# Patient Record
Sex: Female | Born: 1960
Health system: Southern US, Community
[De-identification: ages and names within clinical notes are randomized; demographics above are authoritative.]

## PROBLEM LIST (undated history)

## (undated) DIAGNOSIS — N871 Moderate cervical dysplasia: Secondary | ICD-10-CM

## (undated) DIAGNOSIS — F329 Major depressive disorder, single episode, unspecified: Secondary | ICD-10-CM

## (undated) DIAGNOSIS — F909 Attention-deficit hyperactivity disorder, unspecified type: Secondary | ICD-10-CM

## (undated) DIAGNOSIS — E559 Vitamin D deficiency, unspecified: Secondary | ICD-10-CM

## (undated) DIAGNOSIS — F32A Depression, unspecified: Secondary | ICD-10-CM

## (undated) DIAGNOSIS — Z9889 Other specified postprocedural states: Secondary | ICD-10-CM

## (undated) DIAGNOSIS — R112 Nausea with vomiting, unspecified: Secondary | ICD-10-CM

## (undated) DIAGNOSIS — R51 Headache: Secondary | ICD-10-CM

## (undated) DIAGNOSIS — F419 Anxiety disorder, unspecified: Secondary | ICD-10-CM

## (undated) HISTORY — DX: Attention-deficit hyperactivity disorder, unspecified type: F90.9

## (undated) HISTORY — DX: Anxiety disorder, unspecified: F41.9

## (undated) HISTORY — DX: Moderate cervical dysplasia: N87.1

## (undated) HISTORY — DX: Depression, unspecified: F32.A

## (undated) HISTORY — DX: Major depressive disorder, single episode, unspecified: F32.9

---

## 1998-10-18 HISTORY — PX: KNEE SURGERY: SHX244

## 2001-03-08 ENCOUNTER — Other Ambulatory Visit: Admission: RE | Admit: 2001-03-08 | Discharge: 2001-03-08 | Payer: Self-pay | Admitting: Gynecology

## 2001-06-02 ENCOUNTER — Encounter: Admission: RE | Admit: 2001-06-02 | Discharge: 2001-08-31 | Payer: Self-pay | Admitting: Gynecology

## 2001-08-30 ENCOUNTER — Inpatient Hospital Stay (HOSPITAL_COMMUNITY): Admission: AD | Admit: 2001-08-30 | Discharge: 2001-09-01 | Payer: Self-pay | Admitting: Gynecology

## 2001-10-16 ENCOUNTER — Other Ambulatory Visit: Admission: RE | Admit: 2001-10-16 | Discharge: 2001-10-16 | Payer: Self-pay | Admitting: Gynecology

## 2001-12-01 ENCOUNTER — Ambulatory Visit (HOSPITAL_COMMUNITY): Admission: RE | Admit: 2001-12-01 | Discharge: 2001-12-01 | Payer: Self-pay | Admitting: Gynecology

## 2002-05-18 ENCOUNTER — Other Ambulatory Visit: Admission: RE | Admit: 2002-05-18 | Discharge: 2002-05-18 | Payer: Self-pay | Admitting: Gynecology

## 2002-10-29 ENCOUNTER — Other Ambulatory Visit: Admission: RE | Admit: 2002-10-29 | Discharge: 2002-10-29 | Payer: Self-pay | Admitting: Gynecology

## 2003-05-17 ENCOUNTER — Ambulatory Visit (HOSPITAL_BASED_OUTPATIENT_CLINIC_OR_DEPARTMENT_OTHER): Admission: RE | Admit: 2003-05-17 | Discharge: 2003-05-17 | Payer: Self-pay | Admitting: Gynecology

## 2004-09-07 ENCOUNTER — Ambulatory Visit: Payer: Self-pay | Admitting: Licensed Clinical Social Worker

## 2004-09-08 ENCOUNTER — Ambulatory Visit: Payer: Self-pay | Admitting: Licensed Clinical Social Worker

## 2004-09-23 ENCOUNTER — Ambulatory Visit: Payer: Self-pay | Admitting: Licensed Clinical Social Worker

## 2004-09-30 ENCOUNTER — Ambulatory Visit: Payer: Self-pay | Admitting: Licensed Clinical Social Worker

## 2004-10-06 ENCOUNTER — Ambulatory Visit: Payer: Self-pay | Admitting: Licensed Clinical Social Worker

## 2004-10-21 ENCOUNTER — Ambulatory Visit: Payer: Self-pay | Admitting: Licensed Clinical Social Worker

## 2004-11-04 ENCOUNTER — Ambulatory Visit: Payer: Self-pay | Admitting: Licensed Clinical Social Worker

## 2004-11-11 ENCOUNTER — Ambulatory Visit: Payer: Self-pay | Admitting: Licensed Clinical Social Worker

## 2005-01-27 ENCOUNTER — Other Ambulatory Visit: Admission: RE | Admit: 2005-01-27 | Discharge: 2005-01-27 | Payer: Self-pay | Admitting: Gynecology

## 2005-08-27 ENCOUNTER — Ambulatory Visit (HOSPITAL_COMMUNITY): Admission: AD | Admit: 2005-08-27 | Discharge: 2005-08-27 | Payer: Self-pay | Admitting: Gynecology

## 2005-10-07 ENCOUNTER — Observation Stay (HOSPITAL_COMMUNITY): Admission: RE | Admit: 2005-10-07 | Discharge: 2005-10-08 | Payer: Self-pay | Admitting: Gynecology

## 2005-10-18 HISTORY — PX: ABDOMINAL HYSTERECTOMY: SHX81

## 2005-10-18 HISTORY — PX: DILATION AND CURETTAGE OF UTERUS: SHX78

## 2012-02-24 ENCOUNTER — Other Ambulatory Visit: Payer: Self-pay | Admitting: *Deleted

## 2012-02-24 DIAGNOSIS — N6489 Other specified disorders of breast: Secondary | ICD-10-CM

## 2012-02-28 ENCOUNTER — Encounter: Payer: Self-pay | Admitting: Gynecology

## 2013-05-24 ENCOUNTER — Encounter: Payer: Self-pay | Admitting: Gynecology

## 2013-07-23 ENCOUNTER — Ambulatory Visit (INDEPENDENT_AMBULATORY_CARE_PROVIDER_SITE_OTHER): Payer: BC Managed Care – PPO | Admitting: Gynecology

## 2013-07-23 ENCOUNTER — Other Ambulatory Visit (HOSPITAL_COMMUNITY)
Admission: RE | Admit: 2013-07-23 | Discharge: 2013-07-23 | Disposition: A | Discharge status: Home / Self Care | Payer: BC Managed Care – PPO | Source: Ambulatory Visit | Attending: Gynecology | Admitting: Gynecology

## 2013-07-23 ENCOUNTER — Encounter: Payer: Self-pay | Admitting: Gynecology

## 2013-07-23 VITALS — BP 132/90 | Ht 63.5 in | Wt 229.0 lb

## 2013-07-23 DIAGNOSIS — Z87410 Personal history of cervical dysplasia: Secondary | ICD-10-CM | POA: Insufficient documentation

## 2013-07-23 DIAGNOSIS — Z23 Encounter for immunization: Secondary | ICD-10-CM

## 2013-07-23 DIAGNOSIS — Z1151 Encounter for screening for human papillomavirus (HPV): Secondary | ICD-10-CM | POA: Insufficient documentation

## 2013-07-23 DIAGNOSIS — F329 Major depressive disorder, single episode, unspecified: Secondary | ICD-10-CM | POA: Insufficient documentation

## 2013-07-23 DIAGNOSIS — R635 Abnormal weight gain: Secondary | ICD-10-CM | POA: Insufficient documentation

## 2013-07-23 DIAGNOSIS — F32A Depression, unspecified: Secondary | ICD-10-CM | POA: Insufficient documentation

## 2013-07-23 DIAGNOSIS — Z01419 Encounter for gynecological examination (general) (routine) without abnormal findings: Secondary | ICD-10-CM | POA: Insufficient documentation

## 2013-07-23 LAB — CBC WITH DIFFERENTIAL/PLATELET
Basophils Absolute: 0 10*3/uL (ref 0.0–0.1)
Basophils Relative: 1 % (ref 0–1)
Eosinophils Absolute: 0.1 10*3/uL (ref 0.0–0.7)
Eosinophils Relative: 2 % (ref 0–5)
HCT: 42.8 % (ref 36.0–46.0)
Hemoglobin: 14.8 g/dL (ref 12.0–15.0)
Lymphocytes Relative: 32 % (ref 12–46)
Lymphs Abs: 2.3 10*3/uL (ref 0.7–4.0)
MCH: 28 pg (ref 26.0–34.0)
MCHC: 34.6 g/dL (ref 30.0–36.0)
MCV: 81.1 fL (ref 78.0–100.0)
Monocytes Absolute: 0.5 10*3/uL (ref 0.1–1.0)
Monocytes Relative: 7 % (ref 3–12)
Neutro Abs: 4.4 10*3/uL (ref 1.7–7.7)
Neutrophils Relative %: 58 % (ref 43–77)
Platelets: 308 10*3/uL (ref 150–400)
RBC: 5.28 MIL/uL — ABNORMAL HIGH (ref 3.87–5.11)
RDW: 13.4 % (ref 11.5–15.5)
WBC: 7.4 10*3/uL (ref 4.0–10.5)

## 2013-07-23 LAB — COMPREHENSIVE METABOLIC PANEL
ALT: 11 U/L (ref 0–35)
AST: 13 U/L (ref 0–37)
Albumin: 4.1 g/dL (ref 3.5–5.2)
Alkaline Phosphatase: 86 U/L (ref 39–117)
BUN: 8 mg/dL (ref 6–23)
CO2: 27 mEq/L (ref 19–32)
Calcium: 9.2 mg/dL (ref 8.4–10.5)
Chloride: 108 mEq/L (ref 96–112)
Creat: 0.75 mg/dL (ref 0.50–1.10)
Glucose, Bld: 95 mg/dL (ref 70–99)
Potassium: 4.6 mEq/L (ref 3.5–5.3)
Sodium: 139 mEq/L (ref 135–145)
Total Bilirubin: 0.7 mg/dL (ref 0.3–1.2)
Total Protein: 6.9 g/dL (ref 6.0–8.3)

## 2013-07-23 LAB — TSH: TSH: 2.134 u[IU]/mL (ref 0.350–4.500)

## 2013-07-23 LAB — CHOLESTEROL, TOTAL: Cholesterol: 187 mg/dL (ref 0–200)

## 2013-07-23 NOTE — Addendum Note (Signed)
Addended by: Bertram Savin A on: 07/23/2013 12:45 PM   Modules accepted: Orders

## 2013-07-23 NOTE — Patient Instructions (Addendum)
Colonoscopy A colonoscopy is an exam to evaluate your entire colon. In this exam, your colon is cleansed. A long fiberoptic tube is inserted through your rectum and into your colon. The fiberoptic scope (endoscope) is a long bundle of enclosed and very flexible fibers. These fibers transmit light to the area examined and send images from that area to your caregiver. Discomfort is usually minimal. You may be given a drug to help you sleep (sedative) during or prior to the procedure. This exam helps to detect lumps (tumors), polyps, inflammation, and areas of bleeding. Your caregiver may also take a small piece of tissue (biopsy) that will be examined under a microscope. LET YOUR CAREGIVER KNOW ABOUT:   Allergies to food or medicine.  Medicines taken, including vitamins, herbs, eyedrops, over-the-counter medicines, and creams.  Use of steroids (by mouth or creams).  Previous problems with anesthetics or numbing medicines.  History of bleeding problems or blood clots.  Previous surgery.  Other health problems, including diabetes and kidney problems.  Possibility of pregnancy, if this applies. BEFORE THE PROCEDURE   A clear liquid diet may be required for 2 days before the exam.  Ask your caregiver about changing or stopping your regular medications.  Liquid injections (enemas) or laxatives may be required.  A large amount of electrolyte solution may be given to you to drink over a short period of time. This solution is used to clean out your colon.  You should be present 60 minutes prior to your procedure or as directed by your caregiver. AFTER THE PROCEDURE   If you received a sedative or pain relieving medication, you will need to arrange for someone to drive you home.  Occasionally, there is a little blood passed with the first bowel movement. Do not be concerned. FINDING OUT THE RESULTS OF YOUR TEST Not all test results are available during your visit. If your test results are  not back during the visit, make an appointment with your caregiver to find out the results. Do not assume everything is normal if you have not heard from your caregiver or the medical facility. It is important for you to follow up on all of your test results. HOME CARE INSTRUCTIONS   It is not unusual to pass moderate amounts of gas and experience mild abdominal cramping following the procedure. This is due to air being used to inflate your colon during the exam. Walking or a warm pack on your belly (abdomen) may help.  You may resume all normal meals and activities after sedatives and medicines have worn off.  Only take over-the-counter or prescription medicines for pain, discomfort, or fever as directed by your caregiver. Do not use aspirin or blood thinners if a biopsy was taken. Consult your caregiver for medicine usage if biopsies were taken. SEEK IMMEDIATE MEDICAL CARE IF:   You have a fever.  You pass large blood clots or fill a toilet with blood following the procedure. This may also occur 10 to 14 days following the procedure. This is more likely if a biopsy was taken.  You develop abdominal pain that keeps getting worse and cannot be relieved with medicine. Document Released: 10/01/2000 Document Revised: 12/27/2011 Document Reviewed: 05/16/2008 [facility] Patient Information 2014 [place], [place]. Influenza Vaccine (Flu Vaccine, Inactivated) 2013 2014 What You Need to Know WHY GET VACCINATED?  Influenza ("flu") is a contagious disease that spreads around the [place] every winter, usually between October and May.  Flu is caused by the influenza virus, and can  be spread by coughing, sneezing, and close contact.  Anyone can get flu, but the risk of getting flu is highest among children. Symptoms come on suddenly and may last several days. They can include:  Fever or chills.  Sore throat.  Muscle aches.  Fatigue.  Cough.  Headache.  Runny or stuffy nose. Flu can make  some people much sicker than others. These people include young children, people 57 and older, pregnant women, and people with certain health conditions such as heart, lung or kidney disease, or a weakened immune system. Flu vaccine is especially important for these people, and anyone in close contact with them. Flu can also lead to pneumonia, and make existing medical conditions worse. It can cause diarrhea and seizures in children. Each year thousands of people in the [place] die from flu, and many more are hospitalized. Flu vaccine is the best protection we have from flu and its complications. Flu vaccine also helps prevent spreading flu from person to person. INACTIVATED FLU VACCINE There are 2 types of influenza vaccine:  You are getting an inactivated flu vaccine, which does not contain any live influenza virus. It is given by injection with a needle, and often called the "flu shot."  A different live, attenuated (weakened) influenza vaccine is sprayed into the nostrils. This vaccine is described in a separate Vaccine Information Statement. Flu vaccine is recommended every year. Children 6 months through 32 years of age should get 2 doses the first year they get vaccinated. Flu viruses are always changing. Each year's flu vaccine is made to protect from viruses that are most likely to cause disease that year. While flu vaccine cannot prevent all cases of flu, it is our best defense against the disease. Inactivated flu vaccine protects against 3 or 4 different influenza viruses. It takes about 2 weeks for protection to develop after the vaccination, and protection lasts several months to a year. Some illnesses that are not caused by influenza virus are often mistaken for flu. Flu vaccine will not prevent these illnesses. It can only prevent influenza. A "high-dose" flu vaccine is available for people 49 years of age and older. The person giving you the vaccine can tell you more about  it. Some inactivated flu vaccine contains a very small amount of a mercury-based preservative called thimerosal. Studies have shown that thimerosal in vaccines is not harmful, but flu vaccines that do not contain a preservative are available. SOME PEOPLE SHOULD NOT GET THIS VACCINE Tell the person who gives you the vaccine:  If you have any severe (life-threatening) allergies. If you ever had a life-threatening allergic reaction after a dose of flu vaccine, or have a severe allergy to any part of this vaccine, you may be advised not to get a dose. Most, but not all, types of flu vaccine contain a small amount of egg.  If you ever had Guillain Barr Syndrome (a severe paralyzing illness, also called GBS). Some people with a history of GBS should not get this vaccine. This should be discussed with your doctor.  If you are not feeling well. They might suggest waiting until you feel better. But you should come back. RISKS OF A VACCINE REACTION With a vaccine, like any medicine, there is a chance of side effects. These are usually mild and go away on their own. Serious side effects are also possible, but are very rare. Inactivated flu vaccine does not contain live flu virus, sogetting flu from this vaccine is not possible. Brief  fainting spells and related symptoms (such as jerking movements) can happen after any medical procedure, including vaccination. Sitting or lying down for about 15 minutes after a vaccination can help prevent fainting and injuries caused by falls. Tell your doctor if you feel dizzy or lightheaded, or have vision changes or ringing in the ears. Mild problems following inactivated flu vaccine:  Soreness, redness, or swelling where the shot was given.  Hoarseness; sore, red or itchy eyes; or cough.  Fever.  Aches.  Headache.  Itching.  Fatigue. If these problems occur, they usually begin soon after the shot and last 1 or 2 days. Moderate problems following inactivated  flu vaccine:  Young children who get inactivated flu vaccine and pneumococcal vaccine (PCV13) at the same time may be at increased risk for seizures caused by fever. Ask your doctor for more information. Tell your doctor if a child who is getting flu vaccine has ever had a seizure. Severe problems following inactivated flu vaccine:  A severe allergic reaction could occur after any vaccine (estimated less than 1 in a million doses).  There is a small possibility that inactivated flu vaccine could be associated with Guillan Barr Syndrome (GBS), no more than 1 or 2 cases per million people vaccinated. This is much lower than the risk of severe complications from flu, which can be prevented by flu vaccine. The safety of vaccines is always being monitored. For more information, visit: http://floyd.org/ WHAT IF THERE IS A SERIOUS REACTION? What should I look for?  Look for anything that concerns you, such as signs of a severe allergic reaction, very high fever, or behavior changes. Signs of a severe allergic reaction can include hives, swelling of the face and throat, difficulty breathing, a fast heartbeat, dizziness, and weakness. These would start a few minutes to a few hours after the vaccination. What should I do?  If you think it is a severe allergic reaction or other emergency that cannot wait, call [phone] 1 1  or get the person to the nearest hospital. Otherwise, call your doctor.  Afterward, the reaction should be reported to the Vaccine Adverse Event Reporting System (VAERS). Your doctor might file this report, or you can do it yourself through the VAERS website at www.vaers.LAgents.no, or by calling 1-580-341-9905. VAERS is only for reporting reactions. They do not give medical advice. THE NATIONAL VACCINE INJURY COMPENSATION PROGRAM The National Vaccine Injury Compensation Program (VICP) is a federal program that was created to compensate people who may have been injured by certain  vaccines. Persons who believe they may have been injured by a vaccine can learn about the program and about filing a claim by calling 1-(707)849-4076 or visiting the VICP website at SpiritualWord.at HOW CAN I LEARN MORE?  Ask your doctor.  Call your local or state health department.  Contact the Centers for Disease Control and Prevention (CDC):  Call 929-556-8204 (1-800-CDC-INFO) or  Visit CDC's website at BiotechRoom.com.cy CDC Inactivated Influenza Vaccine Interim VIS (05/12/12) Document Released: 07/29/2006 Document Revised: 06/28/2012 Document Reviewed: 05/12/2012 [facility] Patient Information 2014 [place], [place].  Perimenopause Perimenopause is the time when your body begins to move into the menopause (no menstrual period for 12 straight months). It is a natural process. Perimenopause can begin 2 to 8 years before the menopause and usually lasts for one year after the menopause. During this time, your ovaries may or may not produce an egg. The ovaries vary in their production of estrogen and progesterone hormones each month. This can cause irregular menstrual  periods, difficulty in getting pregnant, vaginal bleeding between periods and uncomfortable symptoms. CAUSES  Irregular production of the ovarian hormones, estrogen and progesterone, and not ovulating every month.  Other causes include:  Tumor of the pituitary gland in the brain.  Medical disease that affects the ovaries.  Radiation treatment.  Chemotherapy.  Unknown causes.  Heavy smoking and excessive alcohol intake can bring on perimenopause sooner. SYMPTOMS   Hot flashes.  Night sweats.  Irregular menstrual periods.  Decrease sex drive.  Vaginal dryness.  Headaches.  Mood swings.  Depression.  Memory problems.  Irritability.  Tiredness.  Weight gain.  Trouble getting pregnant.  The beginning of losing bone cells (osteoporosis).  The beginning of hardening of the arteries  (atherosclerosis). DIAGNOSIS  Your caregiver will make a diagnosis by analyzing your age, menstrual history and your symptoms. They will do a physical exam noting any changes in your body, especially your female organs. Female hormone tests may or may not be helpful depending on the amount and when you produce the female hormones. However, other hormone tests may be helpful (ex. thyroid hormone) to rule out other problems. TREATMENT  The decision to treat during the perimenopause should be made by you and your caregiver depending on how the symptoms are affecting you and your life style. There are various treatments available such as:  Treating individual symptoms with a specific medication for that symptom (ex. tranquilizer for depression).  Herbal medications that can help specific symptoms.  Counseling.  Group therapy.  No treatment. HOME CARE INSTRUCTIONS   Before seeing your caregiver, make a list of your menstrual periods (when the occur, how heavy they are, how long between periods and how long they last), your symptoms and when they started.  Take the medication as recommended by your caregiver.  Sleep and rest.  Exercise.  Eat a diet that contains calcium (good for your bones) and soy (acts like estrogen hormone).  Do not smoke.  Avoid alcoholic beverages.  Taking vitamin E may help in certain cases.  Take calcium and vitamin D supplements to help prevent bone loss.  Group therapy is sometimes helpful.  Acupuncture may help in some cases. SEEK MEDICAL CARE IF:   You have any of the above and want to know if it is perimenopause.  You want advice and treatment for any of your symptoms mentioned above.  You need a referral to a specialist (gynecologist, psychiatrist or psychologist). SEEK IMMEDIATE MEDICAL CARE IF:   You have vaginal bleeding.  Your period lasts longer than 8 days.  You periods are recurring sooner than 21 days.  You have bleeding after  intercourse.  You have severe depression.  You have pain when you urinate.  You have severe headaches.  You develop vision problems. Document Released: 11/11/2004 Document Revised: 12/27/2011 Document Reviewed: 08/01/2008 [facility] Patient Information 2014 [place], [place]. Bariatric Surgery (Gastrointestinal Surgery for Severe Obesity) Severe obesity is a longstanding condition. It is difficult to treat through diet and exercise alone. Gastrointestinal surgery is the best option for people who are severely obese and cannot lose weight by traditional means, or who suffer from serious obesity-related health problems. The surgery promotes weight loss by decreasing the absorption of food and, in some operations, interrupting the digestive process. As in other treatments for obesity, the best results are achieved with healthy eating behaviors and regular physical activity.  People who may consider gastrointestinal surgery include those with a body mass index (BMI) above 40. This is about 100 pounds  of overweight for men and 80 pounds for women. People with a BMI between 35 and 40 and who suffer from type 2 diabetes or life-threatening cardiopulmonary (heart and lung) problems, such as severe sleep apnea or obesity-related heart disease, may also be candidates for surgery. (To use the Body Mass Index chart. find your weight on the bottom of the graph. Go straight up from that point until you come to the line that matches your height. Then look to find your weight group). The idea of gastrointestinal surgery to control obesity grew out of results of operations for cancer or severe ulcers that removed large portions of the stomach or small intestine. Patients undergoing these procedures tended to lose weight after surgery. So some physicians began to use such operations to treat severe obesity. The first operation that was widely used for severe obesity was the intestinal bypass. This operation was first used  40 years ago. It produced weight loss by causing malabsorption. The idea was that patients could eat large amounts of food, which would be poorly digested or passed along too fast for the body to absorb many calories. The problem with this surgery was that it caused a loss of essential nutrients. Also, its side effects were unpredictable and sometimes fatal. The original form of the intestinal bypass operation is no longer used. THE NORMAL DIGESTIVE PROCESS Normally, as food moves along the digestive tract, digestive juices and enzymes digest and absorb calories and nutrients. After we chew and swallow our food, it moves down the esophagus to the stomach. There a strong acid continues the digestive process. The stomach can hold about 3 pints of food at one time. When the stomach contents move to the first portion of the small intestine (duodenum ), bile and pancreatic juice speed up digestion. Most of the iron and calcium in the foods we eat is absorbed in the duodenum. The jejunum and ileum are the remaining two segments of the nearly 20 feet of small intestine. They complete the absorption of almost all calories and nutrients. The food particles that cannot be digested in the small intestine are stored in the large intestine until eliminated.  HOW DOES SURGERY PROMOTE WEIGHT LOSS? Gastrointestinal surgery for obesity is also called bariatric surgery. It alters the digestive process. The operations promote weight loss by closing off parts of the stomach. This will make it smaller. Operations that only reduce stomach size are known as "restrictive operations". They restrict the amount of food the stomach can hold. Some operations combine stomach restriction with a partial bypass of the small intestine. These procedures create a direct connection from the stomach to the lower segment of the small intestine. This causes bypassing portions of the digestive tract that absorb calories and nutrients. These are known  as malabsorptive operations. WHAT ARE THE SURGICAL OPTIONS? There are several types of restrictive and malabsorptive operations. Each one carries its own benefits and risks.  Restrictive Operations  Restrictive operations serve only to restrict food intake. They do not interfere with the normal digestive process. To perform the surgery, doctors create a small pouch at the top of the stomach where food enters from the esophagus. At first, the pouch holds about 1 ounce of food. It later expands to 2-3 ounces. The lower outlet of the pouch usually has a diameter of only about  inch. This small outlet delays the emptying of food from the pouch and causes a feeling of fullness. As a result of this surgery, most people lose  the ability to eat large amounts of food at one time. After an operation, the person usually can eat only  to 1 cup of food without discomfort or nausea. Also, food has to be well chewed. Restrictive operations for obesity include adjustable gastric banding (AGB) and vertical banded gastroplasty (VBG).  Adjustable gastric banding  In this procedure, a hollow band made of special material is placed around the stomach near its upper end. This creates a small pouch and a narrow passage into the larger remainder of the stomach. The band is then inflated with a salt solution. It can be tightened or loosened over time to change the size of the passage by increasing or decreasing the amount of salt solution.  The band is adjusted based on feelings of hunger and weight loss. Patients decide when they need an adjustment and come to their surgeons to evaluate this. The adjustment is done as an office visit. The band is fully reversible with a second surgery if the patient changes his/her mind. There is no cutting or re-routing of the intestine.  Vertical banded gastroplasty  VBG has been the most common restrictive operation for weight control. Both a band and staples are used to create a small  stomach pouch. Vertical banded gastroplasty is based on the same principle of restriction as the band. But the stomach is surgically altered with the stapling. This treatment is not reversible.  Restrictive operations lead to weight loss in almost all patients. But they are less successful than malabsorptive operations in achieving substantial, long-term weight loss. About 30 percent of those who undergo VBG achieve normal weight. About 80 percent achieve some degree of weight loss. Some patients regain weight. Others are unable to adjust their eating habits and fail to lose the desired weight. Successful results depend on the patient's willingness to adopt a long-term plan of healthy eating and regular physical activity.  A common risk of restrictive operations is vomiting. This is caused when the small stomach is overly stretched by food particles that have not been chewed well. Band slippage and saline leakage have been reported after AGB. Risks of VBG include wearing away of the band and breakdown of the staple line. In a small number of cases, stomach juices may leak into the abdomen. This requires an emergency operation. In less than 1 percent of all cases, infection or death from complications may occur. Malabsorptive Operations  Malabsorptive operations are the most common gastrointestinal surgeries for weight loss. They restrict both food intake and the amount of calories and nutrients the body absorbs.  Roux-en-Y gastric bypass (RGB)  This operation is the most common and successful malabsorptive surgery. First, a small stomach pouch is created to restrict food intake. Next, a Y-shaped section of the small intestine is attached to the pouch. This allows food to bypass the lower stomach, the first segment of the small intestine (duodenum), and the first portion of the jejunum (the second segment of the small intestine). This bypass reduces the amount of calories and nutrients the body  absorbs.  Biliopancreatic diversion (BPD)  In this more complicated malabsorptive operation, portions of the stomach are removed. The small pouch that remains is connected directly to the final segment of the small intestine, completely bypassing the duodenum and the jejunum. This procedure successfully promotes weight loss. But it is less frequently used than other types of surgery because of the high risk for nutritional deficiencies. A variation of BPD includes a "duodenal switch". This leaves a larger  portion of the stomach intact, including the pyloric valve. This valve regulates the release of stomach contents into the small intestine. It also keeps a small part of the duodenum in the digestive pathway.  Malabsorptive operations produce more weight loss than restrictive operations. And they are more effective in reversing the health problems associated with severe obesity. Patients who have malabsorptive operations generally lose two-thirds of their excess weight within 2 years.  In addition to the risks of restrictive surgeries, malabsorptive operations also carry greater risk for nutritional deficiencies. This is because the procedure causes food to bypass the duodenum and jejunum. That is where most iron and calcium are absorbed. Menstruating women may develop anemia because not enough vitamin B12 and iron are absorbed. Decreased absorption of calcium may also bring on osteoporosis and metabolic bone disease. Patients are required to take nutritional supplements that usually prevent these deficiencies. Patients who have the biliopancreatic diversion surgery must also take fat-soluble (dissolved by fat) vitamins A, D, E, and K supplements.  RGB and BPD operations may also cause "dumping syndrome". This means that stomach contents move too rapidly through the small intestine. Symptoms include nausea, weakness, sweating, faintness, and sometimes diarrhea after eating. The duodenal switch operation  keeps the pyloric valve intact. So it may reduce the likelihood of dumping syndrome.  The more extensive the bypass, the greater the risk is for complications and nutritional deficiencies. Patients with extensive bypasses of the normal digestive process require close monitoring. They also need life-long use of special foods, supplements, and medications. EXPLORE BENEFITS AND RISKS Surgery to produce weight loss is a serious undertaking. Anyone thinking about surgery should understand what the operation involves. Patients and physicians should carefully consider the following benefits and risks.  Benefits  Right after surgery, most patients lose weight quickly. They continue to lose for 18 to 24 months after the procedure. Most patients regain 5 to 10 percent of the weight they lost. But many maintain a long-term weight loss of about 100 pounds.  Surgery improves most obesity-related conditions. For example, in one study blood sugar levels of 83 percent of obese patients with diabetes returned to normal after surgery. Nearly all patients whose blood sugar levels did not return to normal were older. Or they had lived with diabetes for a long time. Risks  Ten to 20 percent of patients who have weight-loss surgery require follow-up operations to correct complications. Abdominal hernia was the most common complication requiring follow-up surgery. But laparoscopic techniques seem to have solved this problem. In laparoscopy, the surgeon makes one or more small incisions. Slender surgical instruments are passed them. This technique eliminates the need for a large incision. And it creates less tissue damage. Patients who are super obese (greater than 350 pounds) or have had previous abdominal surgery, may not be good candidates for laparoscopy. Less common complications include breakdown of the staple line and stretched stomach outlets.  Some obese patients who have weight-loss surgery develop gallstones. These  are clumps of cholesterol and other matter that form in the gallbladder. During quick or substantial weight loss, one's risk of developing gallstones increases. Taking supplemental bile salts for the first 6 months after surgery can prevent them.  Nearly 30 percent of patients who have weight-loss surgery develop nutritional deficiencies. These include anemia, osteoporosis, and metabolic bone disease. These usually can be avoided if vitamin and mineral intakes are high enough.  Women of childbearing age should avoid pregnancy until their weight becomes stable. Quick weight loss and nutritional  deficiencies can harm a growing fetus.  Other risks of restrictive surgeries include:  Band slippage.  Stomach prolapse.  Band erosion into the lumen of the stomach.  Port infection.  The main risk with malabsorption operations is life threatening. It is the risk of leak from any of the anastomosis. The more involved the operation, the more risk involved.  There is one other risk of having the surgery. If people do not follow a strict diet, they will stretch out their stomach pouches. Then they will not lose weight. MEDICAL COSTS Gastrointestinal surgery costs vary. They depend on the procedure. Medical insurance coverage varies by state and insurance provider. If you are considering gastrointestinal surgery, contact your r egional Medicare or Medicaid office or your insurance plan. Find out from them if the procedure is covered. IS THE SURGERY FOR YOU?  Gastrointestinal surgery may be the next step for people who remain severely obese after trying nonsurgical approaches or have an obesity-related disease. Candidates for surgery have:  A BMI of 40 or more.  A BMI of 35 or more and a life-threatening obesity-related health problem such as:  Diabetes.  Severe sleep apnea.  Heart disease.  Obesity-related physical problems that interfere with:  Employment.  Walking.  Family function. If  you fit the profile for surgery, answers to these questions may help you decide whether weight-loss surgery is appropriate for you. Are you:  Unlikely to lose weight successfully without surgery?  Well informed about the surgical procedure? The effects of treatment?  Determined to lose weight? Improve your health?  Aware of how your life may change after the operation? Adjustment to the side effects of the surgery include the need to chew well and being unable to eat large meals.  Aware of the potential for serious complications? Dietary restrictions? Occasional failures?  Committed to lifelong medical follow-up?  Restrictive operations are very successful with patients who follow a diet created by a dietician. Support groups and follow up with caregivers is important. Remember: There are no guarantees for any method to produce and maintain weight loss. This includes surgery. Success is possible only with:  Maximum cooperation.  Commitment to behavioral change.  Medical follow-up. This cooperation and commitment must be carried out for the rest of your life.  ADDITIONAL RESOURCES [employer] Met[employer] Bariatric [employer] [address], [address] [place], [place] [address] www.asmbs.org  Weight-control Information Network (WIN) [address] [place] [address] FindSpin.nl Document Released: 10/04/2005 Document Revised: 12/27/2011 Document Reviewed: 12/28/2006 [employer] Patient Information 2014 [place], [place].

## 2013-07-23 NOTE — Progress Notes (Signed)
Brittany Neal 10/11/1961 [id number]   History:    52 y.o.  for annual gyn exam who has not been seen in the office in over 7 years. 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 2003 endocervical curettage benign endocervix and attached squamous fragments of high-grade dysplasia  2003 the patient had cervical LEEP cone biopsies the pathology report was benign cervical mucosa with no residual high-grade dysplasia identified 2004 diagnostic hysteroscopy with dilatation and curettage lysis of cervical adhesion 2006 cervical dilatation and fractional D&C as a result of severe dysmenorrhea and hematocrit was cervical stenosis Endometrial biopsy with secretory endometrium In 2006 trans- vaginal hysterectomy with pathology report demonstrating squamous metaplasia, secretory endometrium. No evidence of hyperplasia or carcinoma. Adenomyosis, intramural leiomyomata. No dysplasia.   Patient complaint weight gain. She weighs 229 pounds (BMI 39.93). Patient has tried exercise and guiding with no success. Patient also has been seeing a therapist who has her Wellbutrin for depression. Her last mammogram was August of this year which was normal although dense but she did have a 3 day period patient has not had colonoscopy yet.  Past medical history,surgical history, family history and social history were all reviewed and documented in the EPIC chart.  Gynecologic History No LMP recorded. Patient has had a hysterectomy. Contraception: status post hysterectomy Last Pap: 2006. Results were: normal Last mammogram: 2014. Results were: normal  Obstetric History OB History  Gravida Para Term Preterm AB SAB TAB Ectopic Multiple Living  3 2   1     2     # Outcome Date GA Lbr Len/2nd Weight Sex Delivery Anes PTL Lv  3 ABT           2 PAR           1 PAR                ROS: A ROS was performed and pertinent positives and negatives are included in the  history.  GENERAL: No fevers or chills. HEENT: No change in vision, no earache, sore throat or sinus congestion. NECK: No pain or stiffness. CARDIOVASCULAR: No chest pain or pressure. No palpitations. PULMONARY: No shortness of breath, cough or wheeze. GASTROINTESTINAL: No abdominal pain, nausea, vomiting or diarrhea, melena or bright red blood per rectum. GENITOURINARY: No urinary frequency, urgency, hesitancy or dysuria. MUSCULOSKELETAL: No joint or muscle pain, no back pain, no recent trauma. DERMATOLOGIC: No rash, no itching, no lesions. ENDOCRINE: No polyuria, polydipsia, no heat or cold intolerance. No recent change in weight. HEMATOLOGICAL: No anemia or easy bruising or bleeding. NEUROLOGIC: No headache, seizures, numbness, tingling or weakness. PSYCHIATRIC: No depression, no loss of interest in normal activity or change in sleep pattern.     Exam: chaperone present  BP 132/90  Ht 5' 3.5" (1.613 m)  Wt 229 lb (103.874 kg)  BMI 39.92 kg/m2  Body mass index is 39.92 kg/(m^2).  General appearance : Well developed well nourished female. No acute distress HEENT: Neck supple, trachea midline, no carotid bruits, no thyroidmegaly Lungs: Clear to auscultation, no rhonchi or wheezes, or rib retractions  Heart: Regular rate and rhythm, no murmurs or gallops Breast:Examined in sitting and supine position were symmetrical in appearance, no palpable masses or tenderness,  no skin retraction, no nipple inversion, no nipple discharge, no skin discoloration, no axillary or supraclavicular lymphadenopathy Abdomen: no palpable masses or tenderness, no rebound or guarding Extremities: no edema or skin discoloration or tenderness  Pelvic:  Bartholin, Urethra, Skene Glands: Within normal limits             Vagina: No gross lesions or discharge  Cervix:absence  Uterus absent  Adnexa  Without masses or tenderness  Anus and perineum  normal   Rectovaginal  normal sphincter tone without palpated masses or  tenderness             Hemoccult card provided     Assessment/Plan:  52 y.o. female for annual exam with past history of CIN-3. Transvaginal hysterectomy in 2006 with no dysplasia reported. Patient morbidly obese we'll be referred to the general surgeon for consideration bariatric surgery. She was monitored monthly breast exam. She received the flu vaccine today. She was given the telephone number of gastroenterologist to schedule her screening colonoscopy. She was reminded [place] into the office the Hemoccult cards for testing. Pap smear was done today. The following labs were ordered: Screen cholesterol, comprehensive metabolic panel, TSH, urinalysis.  Note: This dictation was prepared with  Dragon/digital dictation along withSmart phrase technology. Any transcriptional errors that result from this process are unintentional.   Ok Edwards MD, 12:16 PM 07/23/2013

## 2013-07-24 ENCOUNTER — Encounter: Payer: Self-pay | Admitting: Gastroenterology

## 2013-07-24 LAB — URINALYSIS W MICROSCOPIC + REFLEX CULTURE
Bilirubin Urine: NEGATIVE
Casts: NONE SEEN
Crystals: NONE SEEN
Glucose, UA: NEGATIVE mg/dL
Ketones, ur: NEGATIVE mg/dL
Leukocytes, UA: NEGATIVE
Nitrite: NEGATIVE
Protein, ur: NEGATIVE mg/dL
Specific Gravity, Urine: 1.013 (ref 1.005–1.030)
Urobilinogen, UA: 1 mg/dL (ref 0.0–1.0)
pH: 7 (ref 5.0–8.0)

## 2013-07-25 ENCOUNTER — Other Ambulatory Visit: Payer: Self-pay | Admitting: Gynecology

## 2013-07-25 MED ORDER — NITROFURANTOIN MONOHYD MACRO 100 MG PO CAPS: 100.0000 mg | ORAL_CAPSULE | Freq: Two times a day (BID) | ORAL | Status: DC

## 2013-07-27 LAB — URINE CULTURE: Colony Count: >=100000

## 2013-08-06 ENCOUNTER — Encounter: Payer: Self-pay | Admitting: Gynecology

## 2013-08-13 ENCOUNTER — Other Ambulatory Visit: Payer: BC Managed Care – PPO | Admitting: Anesthesiology

## 2013-08-13 DIAGNOSIS — Z1211 Encounter for screening for malignant neoplasm of colon: Secondary | ICD-10-CM

## 2013-08-29 ENCOUNTER — Ambulatory Visit (AMBULATORY_SURGERY_CENTER): Payer: Self-pay | Admitting: *Deleted

## 2013-08-29 VITALS — Ht 63.5 in | Wt 233.2 lb

## 2013-08-29 DIAGNOSIS — Z1211 Encounter for screening for malignant neoplasm of colon: Secondary | ICD-10-CM

## 2013-08-29 MED ORDER — MOVIPREP 100 G PO SOLR: ORAL | Status: DC

## 2013-08-29 NOTE — Progress Notes (Signed)
No allergies to eggs or soy. No problems with anesthesia.  

## 2013-08-30 ENCOUNTER — Encounter: Payer: Self-pay | Admitting: Gastroenterology

## 2013-09-18 NOTE — Addendum Note (Signed)
Addended by: Maple Hudson on: 09/18/2013 12:45 PM   Modules accepted: Level of Service

## 2013-09-25 ENCOUNTER — Other Ambulatory Visit: Payer: BC Managed Care – PPO | Admitting: Gastroenterology

## 2013-09-25 ENCOUNTER — Telehealth: Payer: Self-pay | Admitting: Gastroenterology

## 2013-09-25 NOTE — Telephone Encounter (Signed)
Thank you :)

## 2014-02-06 ENCOUNTER — Other Ambulatory Visit (INDEPENDENT_AMBULATORY_CARE_PROVIDER_SITE_OTHER): Payer: Self-pay

## 2014-02-06 ENCOUNTER — Encounter (INDEPENDENT_AMBULATORY_CARE_PROVIDER_SITE_OTHER): Payer: Self-pay | Admitting: General Surgery

## 2014-02-06 ENCOUNTER — Other Ambulatory Visit (INDEPENDENT_AMBULATORY_CARE_PROVIDER_SITE_OTHER): Payer: Self-pay | Admitting: General Surgery

## 2014-02-06 ENCOUNTER — Ambulatory Visit (INDEPENDENT_AMBULATORY_CARE_PROVIDER_SITE_OTHER): Payer: BC Managed Care – PPO | Admitting: General Surgery

## 2014-02-06 VITALS — BP 118/82 | HR 78 | Temp 97.8°F | Resp 16 | Ht 63.75 in | Wt 243.4 lb

## 2014-02-06 LAB — CBC
HCT: 43.8 % (ref 36.0–46.0)
Hemoglobin: 15.2 g/dL — ABNORMAL HIGH (ref 12.0–15.0)
MCH: 28.8 pg (ref 26.0–34.0)
MCHC: 34.7 g/dL (ref 30.0–36.0)
MCV: 83 fL (ref 78.0–100.0)
Platelets: 247 10*3/uL (ref 150–400)
RBC: 5.28 MIL/uL — ABNORMAL HIGH (ref 3.87–5.11)
RDW: 13.5 % (ref 11.5–15.5)
WBC: 8.6 10*3/uL (ref 4.0–10.5)

## 2014-02-06 LAB — PROTIME-INR
INR: 0.96 (ref <1.50)
Prothrombin Time: 12.7 seconds (ref 11.6–15.2)

## 2014-02-06 LAB — IRON: IRON: 104 ug/dL (ref 42–145)

## 2014-02-06 NOTE — Patient Instructions (Signed)
Congratulations on starting your journey to a healthier life! Over the next few weeks you will be undergoing tests (x-rays and labs) and seeing specialists to help evaluate you for weight loss surgery.  These tests and consultations with a psychologist and nutritionist are needed to prepare you for the lifestyle changes that lie ahead and are often required by insurance companies to approve you for surgery.   Pathway to Surgery:  Over the next few weeks -->Lab work -->Radiology tests   - Chest x-ray - make sure your lungs are normal before surgery  - Upper GI - you drink barium and pictures are taken as it travels down your  esophagus and into your stomach - looks for reflux and a hiatal hernia which may  need to repaired at the same time as your weight loss surgery  - Abdominal Ultrasound - looks at your gallbladder and liver  - Mammogram - up to date mammogram if you are a female -->EKG  -->Sleep study - if you are felt to be at high risk for obstructive sleep apnea -->H. Pylori breath test ([employer]) - you surgeon may order this test to see if you have  a bacteria (H pylori) in your stomach which makes you at higher risk to develop a  ulcer or inflammation of your stomach -->Nutrition consultation -->Psychologist consultation -->Other specialist consults - your surgeon may determine that you need to see a  specialist like a cardiologist or pulmnologist depending on your health history -->Watch EMMI video about your planned weight loss surgery -->you can look at www.realize.com to learn more about weight loss surgery and  compare surgery outcomes -->you can look at our new website - [email or web address] - available early May 2015  Two weeks prior to surgery  Go on the extremely low carb liquid diet - this will decrease the size of your liver  which will make surgery safer - the nutritionist will go over this at a later date  Attend preoperative appointment with your surgeon  Attend  preoperative surgery class  One week prior to surgery  No aspirin products.  Tylenol is acceptable   24 hours prior to surgery  No alcoholic beverages  Report fever greater than 100.5 or excessive nasal drainage suggesting infection  Continue bariatric preop diet  Perform bowel prep if ordered  Do not eat or drink anything after midnight the night before surgery  Do not take any medications except those instructed by the anesthesiologist  Morning of surgery  Please arrive at the hospital at least 2 hours before your scheduled surgery time.  No makeup, fingernail polish or jewelry  Bring insurance cards with you  Bring your CPAP mask if you use this

## 2014-02-07 ENCOUNTER — Other Ambulatory Visit (INDEPENDENT_AMBULATORY_CARE_PROVIDER_SITE_OTHER): Payer: Self-pay | Admitting: General Surgery

## 2014-02-07 LAB — URINALYSIS
BILIRUBIN URINE: NEGATIVE
Glucose, UA: NEGATIVE mg/dL
KETONES UR: NEGATIVE mg/dL
Leukocytes, UA: NEGATIVE
NITRITE: NEGATIVE
Protein, ur: NEGATIVE mg/dL
Specific Gravity, Urine: 1.025 (ref 1.005–1.030)
Urobilinogen, UA: 0.2 mg/dL (ref 0.0–1.0)
pH: 6.5 (ref 5.0–8.0)

## 2014-02-07 LAB — COMPREHENSIVE METABOLIC PANEL
ALK PHOS: 82 U/L (ref 39–117)
ALT: 13 U/L (ref 0–35)
AST: 15 U/L (ref 0–37)
Albumin: 4.3 g/dL (ref 3.5–5.2)
BUN: 12 mg/dL (ref 6–23)
CALCIUM: 9.3 mg/dL (ref 8.4–10.5)
CHLORIDE: 108 meq/L (ref 96–112)
CO2: 23 mEq/L (ref 19–32)
CREATININE: 0.84 mg/dL (ref 0.50–1.10)
Glucose, Bld: 90 mg/dL (ref 70–99)
Potassium: 4.4 mEq/L (ref 3.5–5.3)
Sodium: 143 mEq/L (ref 135–145)
Total Bilirubin: 0.9 mg/dL (ref 0.2–1.2)
Total Protein: 7.1 g/dL (ref 6.0–8.3)

## 2014-02-07 LAB — TSH: TSH: 2.296 u[IU]/mL (ref 0.350–4.500)

## 2014-02-07 LAB — VITAMIN D 25 HYDROXY (VIT D DEFICIENCY, FRACTURES): VIT D 25 HYDROXY: 21 ng/mL — AB (ref 30–89)

## 2014-02-07 LAB — H. PYLORI ANTIBODY, IGG: H Pylori IgG: <0.4 {ISR}

## 2014-02-07 LAB — VITAMIN B12: Vitamin B-12: 355 pg/mL (ref 211–911)

## 2014-02-07 LAB — FOLATE: FOLATE: 10.9 ng/mL

## 2014-02-07 LAB — T4: T4 TOTAL: 8.4 ug/dL (ref 5.0–12.5)

## 2014-02-07 NOTE — Progress Notes (Signed)
Patient ID: Brittany Neal, female   DOB: 04-28-1961, 53 y.o.   MRN: [id number]  Chief Complaint  Patient presents with  . Obesity    New bari-sleeve    HPI Brittany Neal is a 53 y.o. female.   HPI 53 year old Caucasian female referred by Dr. Uvaldo Rising for evaluation for weight loss surgery. The patient states that right now she is specifically interested in  sleeve gastrectomy. She initially thought about the LAP-BAND but the idea of having something foreign in her doesn't sit well. She states that she only started gaining weight after her hysterectomy. She states that her hormone levels never really normalized after surgery and she put on a fair amount of weight after her hysterectomy. She states that she is frustrated that she has had difficulty in losing the weight on her own. She is also frustrated that she is not able to participate in activities with her children because of her severe knee pain. She is motivated to lose weight so that she can become more active and to save off future health issues.  Despite numerous attempts for sustained weight loss and she has been unsuccessful. She has tried [facility], [employer], and a physician's weight loss clinic-all without any long-term success. She did go to [facility] and try phentermine but was unsuccessful.  She works as Acupuncturist. She attended our in person seminar.  Past Medical History  Diagnosis Date  . CIN II (cervical intraepithelial neoplasia II)   . Depression   . Anxiety   . ADHD (attention deficit hyperactivity disorder)     Past Surgical History  Procedure Laterality Date  . Knee surgery Left 2000  . Dilation and curettage of uterus  2007  . Abdominal hysterectomy  2007    Family History  Problem Relation Age of Onset  . Heart disease Sister     MV  . Colon cancer Neg Hx     Social History History  Substance Use Topics  . Smoking status: Never Smoker   . Smokeless tobacco:  Never Used  . Alcohol Use: 0.6 oz/week    1 Glasses of wine per week    Allergies  Allergen Reactions  . Percocet [Oxycodone-Acetaminophen] Itching    Current Outpatient Prescriptions  Medication Sig Dispense Refill  . buPROPion (WELLBUTRIN XL) 300 MG 24 hr tablet Take 300 mg by mouth daily.      Marland Kitchen topiramate (TOPAMAX) 100 MG tablet Take 100 mg by mouth 2 (two) times daily.      Marland Kitchen MOVIPREP 100 G SOLR moviprep as directed. No substitutions  1 kit  0   No current facility-administered medications for this visit.    Review of Systems Review of Systems  Constitutional: Negative for fever, activity change, appetite change and unexpected weight change.  HENT: Negative for nosebleeds and trouble swallowing.   Eyes: Negative for photophobia and visual disturbance.  Respiratory: Negative for chest tightness and shortness of breath.        Epworth scale score 10  Cardiovascular: Negative for chest pain and leg swelling.       Denies CP, SOB, orthopnea, PND, DOE; some ankle edema  Gastrointestinal: Negative for nausea, vomiting, diarrhea, constipation and blood in stool.       Denies reflux  Genitourinary: Negative for dysuria and difficulty urinating.       G3P2A1; had hysterectomy due to abnormal cells. mammo within past year - nml per pt.   Musculoskeletal: Negative for arthralgias.  B/l knee pain; has had L knee surgery but needs R knee surgery - was told she needed to lose weight  Skin: Negative for pallor and rash.  Neurological: Positive for headaches. Negative for dizziness, seizures, facial asymmetry and numbness.       Denies TIA and amaurosis fugax   Hematological: Negative for adenopathy. Does not bruise/bleed easily.  Psychiatric/Behavioral: Negative for behavioral problems and agitation.       Some depression; husband has been mean to her at times regarding her weight gain    Blood pressure 118/82, pulse 78, temperature 97.8 F (36.6 C), resp. rate 16, height 5'  3.75" (1.619 m), weight 243 lb 6.4 oz (110.406 kg).  Physical Exam Physical Exam  Vitals reviewed. Constitutional: She is oriented to person, place, and time. She appears well-developed and well-nourished. No distress.  Morbidly obese  HENT:  Head: Normocephalic and atraumatic.  Right Ear: External ear normal.  Left Ear: External ear normal.  Eyes: Conjunctivae are normal. No scleral icterus.  Neck: Normal range of motion. Neck supple. No tracheal deviation present. No thyromegaly present.  Cardiovascular: Normal rate, normal heart sounds and intact distal pulses.   Pulmonary/Chest: Effort normal and breath sounds normal. No respiratory distress. She has no wheezes.  Abdominal: Soft. Normal appearance. She exhibits no distension. There is no tenderness. There is no rebound and no guarding.    Musculoskeletal: Normal range of motion. She exhibits no edema and no tenderness.  Lymphadenopathy:    She has no cervical adenopathy.  Neurological: She is alert and oriented to person, place, and time. She exhibits normal muscle tone.  Skin: Skin is warm and dry. No rash noted. She is not diaphoretic. No erythema. No pallor.  Psychiatric: She has a normal mood and affect. Her behavior is normal. Judgment and thought content normal.    Data Reviewed epworth scale score 10  Assessment    Morbid obesity BMI 42.1 B/l Knee pain Depression Migraines     Plan    The patient meets weight loss surgery criteria. I think the patient would be an acceptable candidate for Laparoscopic vertical sleeve gastrectomy.   We discussed laparoscopic sleeve gastrectomy. We discussed the preoperative, operative and postoperative process. Using diagrams, I explained the surgery in detail including the performance of an EGD near the end of the surgery and an Upper GI swallow study on POD 1. We discussed the typical hospital course including a 2-3 day stay baring any complications.   The patient was given  educational material. I quoted the patient that most patients can lose up to 50-70% of their excess weight. We did discuss the possibility of weight regain several years after the procedure.  The risks of infection, bleeding, pain, scarring, weight regain, too little or too much weight loss, vitamin deficiencies and need for lifelong vitamin supplementation, hair loss, need for protein supplementation, leaks, stricture, reflux, food intolerance, gallstone formation, hernia, need for reoperation and conversion to roux Y gastric bypass, need for open surgery, injury to spleen or surrounding structures, DVT's, PE, and death again discussed with the patient and the patient expressed understanding and desires to proceed with laparoscopic vertical sleeve gastrectomy, possible open, intraoperative endoscopy.  We discussed that before and after surgery that there would be an alteration in their diet. I explained that we have put them on a diet 2 weeks before surgery. I also explained that they would be on a liquid diet for 2 weeks after surgery. We discussed that they would have  to avoid certain foods after surgery. We discussed the importance of physical activity as well as compliance with our dietary and supplement recommendations and routine follow-up.  I explained to the patient that we will start our evaluation process which includes labs, Upper GI to evaluate stomach and swallowing anatomy, nutritionist consultation, psychiatrist consultation, EKG, CXR, abdominal ultrasound, sleep study (because of her epworth scale score of 10).  She was also given EMMI video access to watch video sleeve gastrectomy.  Leighton Ruff. Redmond Pulling, MD, FACS General, Bariatric, & Minimally Invasive Surgery [facility]          Gayland Curry 02/07/2014, 9:27 AM

## 2014-02-08 ENCOUNTER — Telehealth (INDEPENDENT_AMBULATORY_CARE_PROVIDER_SITE_OTHER): Payer: Self-pay | Admitting: General Surgery

## 2014-02-08 NOTE — Telephone Encounter (Signed)
LMOM for patient to let her know that she needs to be on Vit D deficiency ( doesn't prevent us moving forward) she can talk with her PCP, which Dr Andrey CampanileWilson forward lab to her PCP. He can help her with her vit D supplementation but really needs to start taking vit D supplementation

## 2014-02-19 ENCOUNTER — Other Ambulatory Visit: Payer: Self-pay

## 2014-02-19 ENCOUNTER — Ambulatory Visit (HOSPITAL_COMMUNITY)
Admission: RE | Admit: 2014-02-19 | Discharge: 2014-02-19 | Disposition: A | Discharge status: Home / Self Care | Payer: BC Managed Care – PPO | Source: Ambulatory Visit | Attending: General Surgery | Admitting: General Surgery

## 2014-02-19 ENCOUNTER — Ambulatory Visit (HOSPITAL_COMMUNITY)
Admission: RE | Admit: 2014-02-19 | Discharge: 2014-02-19 | Disposition: A | Discharge status: Home / Self Care | Payer: BC Managed Care – PPO | Source: Ambulatory Visit | Attending: Family Medicine | Admitting: Family Medicine

## 2014-02-19 DIAGNOSIS — F341 Dysthymic disorder: Secondary | ICD-10-CM | POA: Insufficient documentation

## 2014-02-19 DIAGNOSIS — K449 Diaphragmatic hernia without obstruction or gangrene: Secondary | ICD-10-CM | POA: Insufficient documentation

## 2014-02-19 DIAGNOSIS — Z6841 Body Mass Index (BMI) 40.0 and over, adult: Secondary | ICD-10-CM | POA: Insufficient documentation

## 2014-02-19 DIAGNOSIS — G43909 Migraine, unspecified, not intractable, without status migrainosus: Secondary | ICD-10-CM | POA: Insufficient documentation

## 2014-02-19 DIAGNOSIS — M25569 Pain in unspecified knee: Secondary | ICD-10-CM | POA: Insufficient documentation

## 2014-02-19 DIAGNOSIS — K7689 Other specified diseases of liver: Secondary | ICD-10-CM | POA: Insufficient documentation

## 2014-02-20 ENCOUNTER — Telehealth (INDEPENDENT_AMBULATORY_CARE_PROVIDER_SITE_OTHER): Payer: Self-pay

## 2014-02-20 ENCOUNTER — Ambulatory Visit (HOSPITAL_BASED_OUTPATIENT_CLINIC_OR_DEPARTMENT_OTHER): Payer: BC Managed Care – PPO | Attending: General Surgery

## 2014-02-20 NOTE — Telephone Encounter (Signed)
She will have to get these from PCP - i don't prescribe these medicaitons

## 2014-02-20 NOTE — Telephone Encounter (Signed)
Pt calling in asking if Dr Andrey CampanileWilson would be willing to refill her Wellbutrin and Topamax. She states PCP office will not refill without and appointment. Advised our doctors do not fill these types of prescriptions for patients and we refer them to their PCP. She is asking if Dr Andrey CampanileWilson would make an exception so she can avoid another doctors appt. Please advise.Marland Kitchen..Marland Kitchen

## 2014-02-20 NOTE — Telephone Encounter (Signed)
Called pt and informed her to call PCP for appt to refill the scripts.

## 2014-03-15 ENCOUNTER — Encounter: Payer: Self-pay | Admitting: Dietician

## 2014-03-15 ENCOUNTER — Encounter: Payer: BC Managed Care – PPO | Attending: General Surgery | Admitting: Dietician

## 2014-03-15 VITALS — Ht 63.75 in | Wt 239.6 lb

## 2014-03-15 DIAGNOSIS — Z713 Dietary counseling and surveillance: Secondary | ICD-10-CM | POA: Insufficient documentation

## 2014-03-15 DIAGNOSIS — Z01818 Encounter for other preprocedural examination: Secondary | ICD-10-CM | POA: Insufficient documentation

## 2014-03-15 DIAGNOSIS — E669 Obesity, unspecified: Secondary | ICD-10-CM

## 2014-03-15 NOTE — Patient Instructions (Signed)
Work on [employer]. Try protein shakes. Call [facility] once surgery is scheduled in order to enroll in Pre-Op Class.

## 2014-03-15 NOTE — Progress Notes (Signed)
  Pre-Op Assessment Visit:  Pre-Operative Sleeve Gastrectomy Surgery  Medical Nutrition Therapy:  Appt start time: 0915   End time:  1000.  Patient was seen on 03/15/2014 for Pre-Operative Sleeve Gastrectomy Nutrition Assessment. Assessment and letter of approval faxed to [facility] Bariatric Surgery Program coordinator on 03/15/2014.   Preferred Learning Style:   No preference indicated   Learning Readiness:   Ready  Handouts given during visit include:  Pre-Op Goals Bariatric Surgery Protein Shakes  Teaching Method Utilized:  Visual Auditory Hands on  Barriers to learning/adherence to lifestyle change: Does not feel hungry and skips a lot of meals  Demonstrated degree of understanding via:  Teach Back   Patient to call the [facility] to enroll in Pre-Op and Post-Op Nutrition Education when surgery date is scheduled.

## 2014-03-18 ENCOUNTER — Ambulatory Visit (HOSPITAL_BASED_OUTPATIENT_CLINIC_OR_DEPARTMENT_OTHER): Payer: BC Managed Care – PPO | Attending: General Surgery | Admitting: Radiology

## 2014-03-18 ENCOUNTER — Encounter (HOSPITAL_BASED_OUTPATIENT_CLINIC_OR_DEPARTMENT_OTHER): Payer: BC Managed Care – PPO

## 2014-03-18 VITALS — Ht 63.0 in | Wt 239.0 lb

## 2014-03-18 DIAGNOSIS — R0989 Other specified symptoms and signs involving the circulatory and respiratory systems: Secondary | ICD-10-CM | POA: Insufficient documentation

## 2014-03-18 DIAGNOSIS — R0609 Other forms of dyspnea: Secondary | ICD-10-CM | POA: Insufficient documentation

## 2014-03-18 DIAGNOSIS — G4733 Obstructive sleep apnea (adult) (pediatric): Secondary | ICD-10-CM

## 2014-03-18 DIAGNOSIS — G4730 Sleep apnea, unspecified: Principal | ICD-10-CM

## 2014-03-18 DIAGNOSIS — G4710 Hypersomnia, unspecified: Secondary | ICD-10-CM | POA: Insufficient documentation

## 2014-03-20 ENCOUNTER — Encounter: Payer: Self-pay | Admitting: Pulmonary Disease

## 2014-03-20 ENCOUNTER — Encounter (INDEPENDENT_AMBULATORY_CARE_PROVIDER_SITE_OTHER): Payer: Self-pay

## 2014-03-20 ENCOUNTER — Ambulatory Visit (INDEPENDENT_AMBULATORY_CARE_PROVIDER_SITE_OTHER): Payer: BC Managed Care – PPO | Admitting: Pulmonary Disease

## 2014-03-20 VITALS — BP 126/84 | HR 88 | Temp 98.2°F | Ht 63.0 in | Wt 242.0 lb

## 2014-03-20 DIAGNOSIS — G4733 Obstructive sleep apnea (adult) (pediatric): Secondary | ICD-10-CM | POA: Insufficient documentation

## 2014-03-20 NOTE — Progress Notes (Signed)
Subjective:    Patient ID: Brittany Neal, female    DOB: 1961/01/13, 53 y.o.   MRN: [id number]  HPI The patient is a 53 year old female who I've been asked to see for possible obstructive sleep apnea. She has had a recent sleep study which showed minimal OSA, with an AHI of 5 events per hour and no significant desaturation. The patient has been noted to have loud snoring, but no one has mentioned an abnormal breathing pattern during sleep.  She has frequent awakenings at night primarily related to her kids and pets, but feels rested greater than 50% of the mornings upon arising. She denies inappropriate daytime sleepiness even with periods of inactivity, and has no issues with sleepiness watching television or movies. He denies any sleepiness with driving. The patient states her weight is up 20 pounds over the last 2 years, and her Epworth score today is 4   Sleep Questionnaire What time do you typically go to bed?( Between what hours) 11-1 am 11-1 am at 0924 on 03/20/14 by Darrell Jewel, CMA How long does it take you to fall asleep? 10 minutes 10 minutes at 0924 on 03/20/14 by Darrell Jewel, CMA How many times during the night do you wake up? 3 3 at 0924 on 03/20/14 by Darrell Jewel, CMA What time do you get out of bed to start your day? 0700 0700 at 0924 on 03/20/14 by Darrell Jewel, CMA Do you drive or operate heavy machinery in your occupation? No No at 0924 on 03/20/14 by Darrell Jewel, CMA How much has your weight changed (up or down) over the past two years? (In pounds) 20 lb (9.072 kg) 20 lb (9.072 kg) at 0924 on 03/20/14 by Darrell Jewel, CMA Have you ever had a sleep study before? Yes Yes at 0924 on 03/20/14 by Darrell Jewel, CMA If yes, location of study? [place]  at 0924 on 03/20/14 by Darrell Jewel, CMA If yes, date of study? 03-18-14 03-18-14 at 0924 on 03/20/14 by Darrell Jewel, CMA Do you currently  use CPAP? No No at 0924 on 03/20/14 by Darrell Jewel, CMA Do you wear oxygen at any time? No No at 0924 on 03/20/14 by Darrell Jewel, CMA   Review of Systems  Constitutional: Negative for fever and unexpected weight change.  HENT: Negative for congestion, dental problem, ear pain, nosebleeds, postnasal drip, rhinorrhea, sinus pressure, sneezing, sore throat and trouble swallowing.   Eyes: Negative for redness and itching.  Respiratory: Positive for shortness of breath. Negative for cough, chest tightness and wheezing.   Cardiovascular: Negative for palpitations and leg swelling.  Gastrointestinal: Negative for nausea and vomiting.  Genitourinary: Negative for dysuria.  Musculoskeletal: Negative for joint swelling.  Skin: Negative for rash.  Neurological: Positive for headaches.  Hematological: Does not bruise/bleed easily.  Psychiatric/Behavioral: Positive for dysphoric mood. The patient is nervous/anxious.        Objective:   Physical Exam Constitutional:  Obese female, no acute distress  HENT:  Nares patent without discharge  Oropharynx without exudate, palate and uvula are moderately elongated.   Eyes:  Perrla, eomi, no scleral icterus  Neck:  No JVD, no TMG  Cardiovascular:  Normal rate, regular rhythm, no rubs or gallops.  No murmurs        Intact distal pulses  Pulmonary :  Normal breath sounds, no stridor or respiratory distress   No rales, rhonchi, or wheezing  Abdominal:  Soft, nondistended, bowel sounds present.  No tenderness noted.   Musculoskeletal:  No lower extremity edema noted.  Lymph Nodes:  No cervical lymphadenopathy noted  Skin:  No cyanosis noted  Neurologic:  Alert, appropriate, moves all 4 extremities without obvious deficit.         Assessment & Plan:

## 2014-03-20 NOTE — Assessment & Plan Note (Signed)
The patient really has minimal OSA by her sleep study, and she is not overly symptomatic during the day. I do not think she needs aggressive treatment with CPAP, even with her upcoming bariatric surgery. I suspect this will resolve very quickly with mild to moderate weight loss.

## 2014-03-20 NOTE — Patient Instructions (Signed)
Your sleep study shows minimal sleep apnea, and should not be an issue for you with your surgery Work on weight loss

## 2014-03-21 ENCOUNTER — Encounter (INDEPENDENT_AMBULATORY_CARE_PROVIDER_SITE_OTHER): Payer: Self-pay | Admitting: General Surgery

## 2014-03-23 DIAGNOSIS — G4730 Sleep apnea, unspecified: Secondary | ICD-10-CM

## 2014-03-23 DIAGNOSIS — G4733 Obstructive sleep apnea (adult) (pediatric): Secondary | ICD-10-CM

## 2014-03-23 DIAGNOSIS — G4710 Hypersomnia, unspecified: Secondary | ICD-10-CM

## 2014-03-23 NOTE — Sleep Study (Signed)
   NAME: Brittany Neal DATE OF BIRTH:  06/08/1961 MEDICAL RECORD NUMBER [id number]  LOCATION: [facility]  PHYSICIAN: Marcel Gary D Aston Lieske  DATE OF STUDY: 03/18/2014  SLEEP STUDY TYPE: Nocturnal Polysomnogram               REFERRING PHYSICIAN: Atilano Ina, MD  INDICATION FOR STUDY: Hypersomnia with sleep apnea  EPWORTH SLEEPINESS SCORE:   4/24 HEIGHT: 5\' 3"  (160 cm)  WEIGHT: 108.41 kg (239 lb)    Body mass index is 42.35 kg/(m^2).  NECK SIZE: 13.5 in.  MEDICATIONS: Charted for review  SLEEP ARCHITECTURE: Total sleep time 329 minutes with sleep efficiency 88.4%. Stage I was 6.2%, stage II 66%, stage III absent, REM 27.8% of total sleep time. Sleep latency 24.5 minutes, REM latency 95.5 minutes, awake after sleep onset 18.5 minutes, arousal index 10, bedtime medication: None  RESPIRATORY DATA: Apnea hypopneas index (AHI) 4.7 per hour. 26 total events scored including 11 obstructive apneas, 3 central apneas, 12 hypopneas. Events were more common while supine. REM AHI of 15.7 per hour. The study was ordered as a diagnostic polysomnogram and CPAP titration was not done.  OXYGEN DATA: Moderate to loud snoring with oxygen desaturation to a nadir of 81% and mean oxygen saturation through the study of 92.1% on room air.  CARDIAC DATA: Sinus rhythm  MOVEMENT/PARASOMNIA: No significant movement disturbance, bathroom x1  IMPRESSION/ RECOMMENDATION:   1) Occasional respiratory events with sleep disturbance, within normal limits. AHI 4.7 per hour (the normal range for adults is an AHI from 0-5 events per hour). Moderate to loud snoring with oxygen desaturation to a nadir of 81% and mean oxygen saturation through the study of 92.1% on room air.   Signed Jetty Duhamel M.D. Waymon Budge Diplomate, Biomedical engineer of Sleep Medicine  ELECTRONICALLY SIGNED ON:  03/23/2014, 1:55 PM [facility] PH: [phone]   FX: [phone] ACCREDITED BY THE  [employer]ICAN ACAD[employer]Y OF SLEEP MEDICINE

## 2014-04-30 ENCOUNTER — Other Ambulatory Visit (INDEPENDENT_AMBULATORY_CARE_PROVIDER_SITE_OTHER): Payer: Self-pay | Admitting: General Surgery

## 2014-05-06 ENCOUNTER — Encounter: Payer: BC Managed Care – PPO | Attending: General Surgery

## 2014-05-06 DIAGNOSIS — Z713 Dietary counseling and surveillance: Secondary | ICD-10-CM | POA: Insufficient documentation

## 2014-05-06 DIAGNOSIS — Z01818 Encounter for other preprocedural examination: Secondary | ICD-10-CM | POA: Insufficient documentation

## 2014-05-07 NOTE — Progress Notes (Signed)
  Pre-Operative Nutrition Class:  Appt start time: 6950   End time:  1830.  Patient was seen on 05/06/2014 for Pre-Operative Bariatric Surgery Education [facility].   Surgery date: 06/03/14 Surgery type: Gastric sleeve Start weight at [facility]: 239.5 lbs on 03/15/2014 Weight today: 244.5 lbs  TANITA  BODY COMP RESULTS  05/06/14   BMI (kg/m^2) 42.6   Fat Mass (lbs) 125.5   Fat Free Mass (lbs) 119   Total Body Water (lbs) 87   Samples given per MNT protocol. Patient educated on appropriate usage: Premier protein shake (chocolate - qty 1) Lot #: [id number] Exp: 01/2015  Bariatric Advantage Calcium Citrate (Cinnamon - qty 1) Lot #: [id number] Exp: 05/2014  Bariactiv Multivitamin (qty 1) Lot #: [id number] Exp: 02/2015  Unjury protein powder (strawberry - qty 1) Lot #: [id number] Exp: 05/2015   The following the learning objectives were met by the patient during this course:  Identify Pre-Op Dietary Goals and will begin 2 weeks pre-operatively  Identify appropriate sources of fluids and proteins   State protein recommendations and appropriate sources pre and post-operatively  Identify Post-Operative Dietary Goals and will follow for 2 weeks post-operatively  Identify appropriate multivitamin and calcium sources  Describe the need for physical activity post-operatively and will follow MD recommendations  State when to call healthcare provider regarding medication questions or post-operative complications  Handouts given during class include:  Pre-Op Bariatric Surgery Diet Handout  Protein Shake Handout  Post-Op Bariatric Surgery Nutrition Handout  BELT Program Information Flyer  Support Group Information Flyer  [facility] Bariatric Supplements Price List  Follow-Up Plan: Patient will follow-up at [facility] 2 weeks post operatively for diet advancement per MD.

## 2014-05-15 ENCOUNTER — Ambulatory Visit (INDEPENDENT_AMBULATORY_CARE_PROVIDER_SITE_OTHER): Payer: BC Managed Care – PPO | Admitting: General Surgery

## 2014-05-15 ENCOUNTER — Encounter (INDEPENDENT_AMBULATORY_CARE_PROVIDER_SITE_OTHER): Payer: Self-pay | Admitting: General Surgery

## 2014-05-15 VITALS — BP 126/72 | HR 88 | Temp 98.1°F | Resp 16 | Ht 63.0 in | Wt 241.0 lb

## 2014-05-15 MED ORDER — OXYCODONE HCL 5 MG/5ML PO SOLN: 5.0000 mg | ORAL | Status: DC | PRN

## 2014-05-15 NOTE — Patient Instructions (Signed)
   Two weeks prior to surgery  Go on the extremely low carb liquid diet - this will decrease the size of your liver  which will make surgery safer - the nutritionist will go over this at a later date  Attend preoperative appointment with your surgeon  Attend preoperative surgery class  One week prior to surgery  No aspirin products.  Tylenol is acceptable   24 hours prior to surgery  No alcoholic beverages  Report fever greater than 100.5 or excessive nasal drainage suggesting infection  Continue bariatric preop diet  Perform bowel prep if ordered  Do not eat or drink anything after midnight the night before surgery  Do not take any medications except those instructed by the anesthesiologist  Morning of surgery  Please arrive at the hospital at least 2 hours before your scheduled surgery time.  No makeup, fingernail polish or jewelry  Bring insurance cards with you  Bring your CPAP mask if you use this

## 2014-05-17 NOTE — Progress Notes (Signed)
Patient ID: Brittany Neal, female   DOB: 09-20-1961, 53 y.o.   MRN: [id number]  Chief Complaint  Patient presents with  . Bariatric Follow Up  . Bariatric Pre-op    HPI Brittany Neal is a 53 y.o. female.   HPI 53 year old Caucasian female comes in today for her preoperative appointment. She is currently scheduled to undergo a laparoscopic sleeve gastrectomy with possible hiatal hernia repair. I initially met her in April of this year. Her weight at that time was 243.6 pounds. She is completed her preoperative workup. She denies any changes since she was last seen except for some left shoulder soreness. Past Medical History  Diagnosis Date  . CIN II (cervical intraepithelial neoplasia II)   . Depression   . Anxiety   . ADHD (attention deficit hyperactivity disorder)     Past Surgical History  Procedure Laterality Date  . Knee surgery Left 2000  . Dilation and curettage of uterus  2007  . Abdominal hysterectomy  2007    Family History  Problem Relation Age of Onset  . Heart disease Sister     MV  . Colon cancer Neg Hx   . Stroke Other   . Obesity Other     Social History History  Substance Use Topics  . Smoking status: Never Smoker   . Smokeless tobacco: Never Used  . Alcohol Use: 0.6 oz/week    1 Glasses of wine per week    Allergies  Allergen Reactions  . Percocet [Oxycodone-Acetaminophen] Itching    Current Outpatient Prescriptions  Medication Sig Dispense Refill  . buPROPion (WELLBUTRIN XL) 300 MG 24 hr tablet Take 300 mg by mouth daily.      Marland Kitchen topiramate (TOPAMAX) 100 MG tablet Take 100 mg by mouth 2 (two) times daily.      Marland Kitchen oxyCODONE (ROXICODONE) 5 MG/5ML solution Take 5-10 mLs (5-10 mg total) by mouth every 4 (four) hours as needed for severe pain.  200 mL  0   No current facility-administered medications for this visit.    Review of Systems Review of Systems  Constitutional: Negative for fever, activity change, appetite change and unexpected weight  change.  HENT: Negative for nosebleeds and trouble swallowing.   Eyes: Negative for photophobia and visual disturbance.  Respiratory: Negative for chest tightness and shortness of breath.   Cardiovascular: Negative for chest pain and leg swelling.       Denies CP, SOB, orthopnea, PND, DOE  Gastrointestinal: Negative for nausea, vomiting, abdominal pain, diarrhea and constipation.       Denies reflux  Genitourinary: Negative for dysuria and difficulty urinating.  Musculoskeletal: Negative for arthralgias.       Bilateral knee pain  Skin: Negative for pallor and rash.  Neurological: Negative for dizziness, seizures, facial asymmetry and numbness.       Denies TIA and amaurosis fugax   Hematological: Negative for adenopathy. Does not bruise/bleed easily.  Psychiatric/Behavioral: Negative for behavioral problems and agitation.    Blood pressure 126/72, pulse 88, temperature 98.1 F (36.7 C), resp. rate 16, height _0  (1.6 m), weight 241 lb (109.317 kg).  Physical Exam Physical Exam  Vitals reviewed. Constitutional: She is oriented to person, place, and time. She appears well-developed and well-nourished. No distress.  Morbidly obese  HENT:  Head: Normocephalic and atraumatic.  Right Ear: External ear normal.  Left Ear: External ear normal.  Eyes: Conjunctivae are normal. No scleral icterus.  Neck: Normal range of motion. Neck supple. No tracheal deviation present.  No thyromegaly present.  Cardiovascular: Normal rate and normal heart sounds.   Pulmonary/Chest: Effort normal and breath sounds normal. No stridor. No respiratory distress. She has no wheezes.  Abdominal: Soft. She exhibits no distension. There is no tenderness. There is no rebound and no guarding.    Musculoskeletal: She exhibits no edema and no tenderness.  Lymphadenopathy:    She has no cervical adenopathy.  Neurological: She is alert and oriented to person, place, and time. She exhibits normal muscle tone.    Skin: Skin is warm and dry. No rash noted. She is not diaphoretic. No erythema.  Psychiatric: She has a normal mood and affect. Her behavior is normal. Judgment and thought content normal.    Data Reviewed My note Dr Janifer Adie office note - occ resp events with sleep disturbance abd u/s - L hepatic lobe with increased echogencity suggestive of steatosis. ugi - small sliding hiatal hernia  Assessment    Morbid obesity BMI 42.69 Mild OSA (no cpap needed per pulm) Depression Vit D def  Hepatic steatosis Migraines Knee pain  Small sliding hiatal hernia    Plan    We reviewed her workup to date. Her initial evaluation labs did not reveal anything surprising other than some vitamin D deficiency. We reviewed the typical surgical course and postoperative course. We discussed the finding of a small hiatal hernia on her upper GI. However she does not have any reflux. I explained that we would test for a hiatal hernia during surgery with the balloon calibration tube. I recommended hiatal hernia if She was found to have a clinically significant defect during surgery. We discussed what that would entail. All of her questions were asked and answered. We discussed the importance of the preoperative diet. We discussed the importance of walking daily prior to surgery as well as after surgery. She was given her postoperative pain medicine prescription today. All of her questions were asked and answered.  Note: This dictation was prepared with Dragon/digital dictation along with [employer]. Any transcriptional errors that result from this process are unintentional.  Leighton Ruff. Redmond Pulling, MD, FACS General, Bariatric, & Minimally Invasive Surgery [facility], [place]        [facility] M 05/17/2014, 5:32 AM

## 2014-05-22 ENCOUNTER — Encounter (HOSPITAL_COMMUNITY): Payer: Self-pay | Admitting: Pharmacy Technician

## 2014-05-27 NOTE — Patient Instructions (Signed)
Your procedure is scheduled on:  06/03/14  MONDAY  [facility] FOLLOW SIGNS TO SHORT STAY CENTER Short Stay Center at    0515   AM.   Call this number if you have problems the morning of surgery: [phone]        Do not eat food  Or drink :After Midnight.SUNDAY NIGHT   Take these medicines the morning of surgery with A SIP OF WATER: WELLBUTRIN, TOPAMAX   .  Contacts, dentures or partial plates, or metal hairpins  can not be worn to surgery. Your family will be responsible for glasses, dentures, hearing aides while you are in surgery  Leave suitcase in the car. After surgery it may be brought to your room.  For patients admitted to the hospital, checkout time is 11:00 AM day of  discharge.         [facility] IS NOT RESPONSIBLE FOR ANY VALUABLES                                                                                                                                           [place] - Preparing for Surgery Before surgery, you can play an important role.  Because skin is not sterile, your skin needs to be as free of germs as possible.  You can reduce the number of germs on your skin by washing with CHG (chlorahexidine gluconate) soap before surgery.  CHG is an antiseptic cleaner which kills germs and bonds with the skin to continue killing germs even after washing. Please DO NOT use if you have an allergy to CHG or antibacterial soaps.  If your skin becomes reddened/irritated stop using the CHG and inform your nurse when you arrive at Short Stay. Do not shave (including legs and underarms) for at least 48 hours prior to the first CHG shower.  You may shave your face/neck. Please follow these instructions carefully:  1.  Shower with CHG Soap the night before surgery and the  morning of Surgery.  2.  If you choose to wash your hair, wash your hair first as usual with your  normal  shampoo.  3.  After you shampoo, rinse your hair and body  thoroughly to remove the  shampoo.                           4.  Use CHG as you would any other liquid soap.  You can apply chg directly  to the skin and wash                       Gently with a scrungie or clean washcloth.  5.  Apply the CHG Soap to your body ONLY FROM THE NECK DOWN.   Do not use on face/ open  Wound or open sores. Avoid contact with eyes, ears mouth and genitals (private parts).                       Wash face,  Genitals (private parts) with your normal soap.             6.  Wash thoroughly, paying special attention to the area where your surgery  will be performed.  7.  Thoroughly rinse your body with warm water from the neck down.  8.  DO NOT shower/wash with your normal soap after using and rinsing off  the CHG Soap.                9.  Pat yourself dry with a clean towel.            10.  Wear clean pajamas.            11.  Place clean sheets on your bed the night of your first shower and do not  sleep with pets. Day of Surgery : Do not apply any lotions/deodorants the morning of surgery.  Please wear clean clothes to the hospital/surgery center.  FAILURE TO FOLLOW THESE INSTRUCTIONS MAY RESULT IN THE CANCELLATION OF YOUR SURGERY PATIENT SIGNATURE_________________________________  NURSE SIGNATURE__________________________________  ________________________________________________________________________

## 2014-05-27 NOTE — Progress Notes (Signed)
Cheat x ray, ekg 5/15 epic, [facility]

## 2014-05-28 ENCOUNTER — Encounter (HOSPITAL_COMMUNITY)
Admission: RE | Admit: 2014-05-28 | Discharge: 2014-05-28 | Disposition: A | Discharge status: Home / Self Care | Payer: BC Managed Care – PPO | Source: Ambulatory Visit | Attending: General Surgery | Admitting: General Surgery

## 2014-05-28 ENCOUNTER — Encounter (HOSPITAL_COMMUNITY): Payer: Self-pay

## 2014-05-28 DIAGNOSIS — Z01812 Encounter for preprocedural laboratory examination: Secondary | ICD-10-CM | POA: Diagnosis not present

## 2014-05-28 DIAGNOSIS — Z01818 Encounter for other preprocedural examination: Secondary | ICD-10-CM | POA: Diagnosis not present

## 2014-05-28 HISTORY — DX: Nausea with vomiting, unspecified: R11.2

## 2014-05-28 HISTORY — DX: Headache: R51

## 2014-05-28 HISTORY — DX: Other specified postprocedural states: Z98.890

## 2014-05-28 HISTORY — DX: Vitamin D deficiency, unspecified: E55.9

## 2014-05-28 LAB — CBC WITH DIFFERENTIAL/PLATELET
Basophils Absolute: 0 10*3/uL (ref 0.0–0.1)
Basophils Relative: 1 % (ref 0–1)
Eosinophils Absolute: 0.1 10*3/uL (ref 0.0–0.7)
Eosinophils Relative: 2 % (ref 0–5)
HEMATOCRIT: 44.3 % (ref 36.0–46.0)
HEMOGLOBIN: 14.4 g/dL (ref 12.0–15.0)
LYMPHS PCT: 26 % (ref 12–46)
Lymphs Abs: 2.2 10*3/uL (ref 0.7–4.0)
MCH: 28.3 pg (ref 26.0–34.0)
MCHC: 32.5 g/dL (ref 30.0–36.0)
MCV: 87 fL (ref 78.0–100.0)
MONO ABS: 0.5 10*3/uL (ref 0.1–1.0)
MONOS PCT: 6 % (ref 3–12)
NEUTROS ABS: 5.5 10*3/uL (ref 1.7–7.7)
Neutrophils Relative %: 65 % (ref 43–77)
Platelets: 230 10*3/uL (ref 150–400)
RBC: 5.09 MIL/uL (ref 3.87–5.11)
RDW: 13.9 % (ref 11.5–15.5)
WBC: 8.4 10*3/uL (ref 4.0–10.5)

## 2014-05-28 LAB — COMPREHENSIVE METABOLIC PANEL
ALT: 14 U/L (ref 0–35)
ANION GAP: 13 (ref 5–15)
AST: 16 U/L (ref 0–37)
Albumin: 4 g/dL (ref 3.5–5.2)
Alkaline Phosphatase: 79 U/L (ref 39–117)
BILIRUBIN TOTAL: 0.7 mg/dL (ref 0.3–1.2)
BUN: 16 mg/dL (ref 6–23)
CHLORIDE: 102 meq/L (ref 96–112)
CO2: 26 meq/L (ref 19–32)
CREATININE: 0.72 mg/dL (ref 0.50–1.10)
Calcium: 9.9 mg/dL (ref 8.4–10.5)
GFR calc Af Amer: >90 mL/min (ref >90)
GFR calc non Af Amer: >90 mL/min (ref >90)
Glucose, Bld: 102 mg/dL — ABNORMAL HIGH (ref 70–99)
Potassium: 4.4 mEq/L (ref 3.7–5.3)
Sodium: 141 mEq/L (ref 137–147)
Total Protein: 7.5 g/dL (ref 6.0–8.3)

## 2014-05-28 NOTE — Progress Notes (Signed)
SEEN BY DR CLANCE--- ON 03/20/14, sleep study 03/18/14

## 2014-05-28 NOTE — Progress Notes (Signed)
confirms no estrogens or progesterone in 4 weeks, instructed  No herbals, antiinflammatories 7 days pre op

## 2014-06-03 ENCOUNTER — Encounter (HOSPITAL_COMMUNITY): Payer: Self-pay | Admitting: *Deleted

## 2014-06-03 ENCOUNTER — Encounter (HOSPITAL_COMMUNITY)
Admission: RE | Disposition: A | Discharge status: Home / Self Care | Payer: Self-pay | Source: Ambulatory Visit | Attending: General Surgery

## 2014-06-03 ENCOUNTER — Encounter (HOSPITAL_COMMUNITY): Payer: BC Managed Care – PPO | Admitting: Certified Registered Nurse Anesthetist

## 2014-06-03 ENCOUNTER — Inpatient Hospital Stay (HOSPITAL_COMMUNITY): Payer: BC Managed Care – PPO | Admitting: Certified Registered Nurse Anesthetist

## 2014-06-03 ENCOUNTER — Inpatient Hospital Stay (HOSPITAL_COMMUNITY)
Admission: RE | Admit: 2014-06-03 | Discharge: 2014-06-06 | DRG: 621 | Disposition: A | Discharge status: Home / Self Care | Payer: BC Managed Care – PPO | Source: Ambulatory Visit | Attending: General Surgery | Admitting: General Surgery

## 2014-06-03 DIAGNOSIS — M25569 Pain in unspecified knee: Secondary | ICD-10-CM | POA: Diagnosis present

## 2014-06-03 DIAGNOSIS — G43909 Migraine, unspecified, not intractable, without status migrainosus: Secondary | ICD-10-CM | POA: Diagnosis present

## 2014-06-03 DIAGNOSIS — G4733 Obstructive sleep apnea (adult) (pediatric): Secondary | ICD-10-CM | POA: Diagnosis present

## 2014-06-03 DIAGNOSIS — E559 Vitamin D deficiency, unspecified: Secondary | ICD-10-CM | POA: Diagnosis present

## 2014-06-03 DIAGNOSIS — F3289 Other specified depressive episodes: Secondary | ICD-10-CM | POA: Diagnosis present

## 2014-06-03 DIAGNOSIS — Z01812 Encounter for preprocedural laboratory examination: Secondary | ICD-10-CM

## 2014-06-03 DIAGNOSIS — K449 Diaphragmatic hernia without obstruction or gangrene: Secondary | ICD-10-CM | POA: Diagnosis present

## 2014-06-03 DIAGNOSIS — F32A Depression, unspecified: Secondary | ICD-10-CM | POA: Diagnosis present

## 2014-06-03 DIAGNOSIS — Z87410 Personal history of cervical dysplasia: Secondary | ICD-10-CM

## 2014-06-03 DIAGNOSIS — F411 Generalized anxiety disorder: Secondary | ICD-10-CM | POA: Diagnosis present

## 2014-06-03 DIAGNOSIS — F329 Major depressive disorder, single episode, unspecified: Secondary | ICD-10-CM | POA: Diagnosis present

## 2014-06-03 DIAGNOSIS — K7689 Other specified diseases of liver: Secondary | ICD-10-CM | POA: Diagnosis present

## 2014-06-03 DIAGNOSIS — Z79899 Other long term (current) drug therapy: Secondary | ICD-10-CM | POA: Diagnosis not present

## 2014-06-03 DIAGNOSIS — F909 Attention-deficit hyperactivity disorder, unspecified type: Secondary | ICD-10-CM | POA: Diagnosis present

## 2014-06-03 DIAGNOSIS — Z823 Family history of stroke: Secondary | ICD-10-CM

## 2014-06-03 DIAGNOSIS — Z885 Allergy status to narcotic agent status: Secondary | ICD-10-CM

## 2014-06-03 DIAGNOSIS — Z6841 Body Mass Index (BMI) 40.0 and over, adult: Secondary | ICD-10-CM

## 2014-06-03 DIAGNOSIS — Z9884 Bariatric surgery status: Secondary | ICD-10-CM

## 2014-06-03 HISTORY — PX: LAPAROSCOPIC GASTRIC SLEEVE RESECTION: SHX5895

## 2014-06-03 LAB — HEMOGLOBIN AND HEMATOCRIT, BLOOD
HCT: 41.1 % (ref 36.0–46.0)
Hemoglobin: 13.8 g/dL (ref 12.0–15.0)

## 2014-06-03 SURGERY — GASTRECTOMY, SLEEVE, LAPAROSCOPIC
Anesthesia: General | Site: Abdomen

## 2014-06-03 MED ORDER — KETOROLAC TROMETHAMINE 30 MG/ML IJ SOLN
15.0000 mg | Freq: Once | INTRAMUSCULAR | Status: AC | PRN
  Administered 2014-06-03: 30 mg via INTRAVENOUS

## 2014-06-03 MED ORDER — ONDANSETRON HCL 4 MG/2ML IJ SOLN
INTRAMUSCULAR | Status: AC
  Filled 2014-06-03: qty 2

## 2014-06-03 MED ORDER — TISSEEL VH 10 ML EX KIT
PACK | CUTANEOUS | Status: AC
  Filled 2014-06-03: qty 1

## 2014-06-03 MED ORDER — GLYCOPYRROLATE 0.2 MG/ML IJ SOLN
INTRAMUSCULAR | Status: AC
  Filled 2014-06-03: qty 4

## 2014-06-03 MED ORDER — HYDROMORPHONE HCL PF 1 MG/ML IJ SOLN
0.2500 mg | INTRAMUSCULAR | Status: DC | PRN
  Administered 2014-06-03 (×3): 0.5 mg via INTRAVENOUS

## 2014-06-03 MED ORDER — GLYCOPYRROLATE 0.2 MG/ML IJ SOLN
INTRAMUSCULAR | Status: DC | PRN
  Administered 2014-06-03: .8 mg via INTRAVENOUS

## 2014-06-03 MED ORDER — MIDAZOLAM HCL 5 MG/5ML IJ SOLN
INTRAMUSCULAR | Status: DC | PRN
Start: 2014-06-03 — End: 2014-06-03
  Administered 2014-06-03: 2 mg via INTRAVENOUS

## 2014-06-03 MED ORDER — UNJURY CHICKEN SOUP POWDER
2.0000 [oz_av] | Freq: Four times a day (QID) | ORAL | Status: DC
  Administered 2014-06-05: 2 [oz_av] via ORAL

## 2014-06-03 MED ORDER — DEXAMETHASONE SODIUM PHOSPHATE 10 MG/ML IJ SOLN
INTRAMUSCULAR | Status: DC | PRN
  Administered 2014-06-03: 10 mg via INTRAVENOUS

## 2014-06-03 MED ORDER — METOCLOPRAMIDE HCL 5 MG/ML IJ SOLN
INTRAMUSCULAR | Status: AC
  Filled 2014-06-03: qty 2

## 2014-06-03 MED ORDER — FENTANYL CITRATE 0.05 MG/ML IJ SOLN
INTRAMUSCULAR | Status: AC
  Filled 2014-06-03: qty 5

## 2014-06-03 MED ORDER — LACTATED RINGERS IV SOLN
INTRAVENOUS | Status: DC
  Administered 2014-06-03: 10:00:00 via INTRAVENOUS

## 2014-06-03 MED ORDER — PROMETHAZINE HCL 25 MG/ML IJ SOLN
12.5000 mg | Freq: Four times a day (QID) | INTRAMUSCULAR | Status: DC | PRN
  Administered 2014-06-03 – 2014-06-05 (×2): 12.5 mg via INTRAVENOUS
  Filled 2014-06-03 (×2): qty 1

## 2014-06-03 MED ORDER — HYDROMORPHONE HCL PF 1 MG/ML IJ SOLN
INTRAMUSCULAR | Status: AC
  Filled 2014-06-03: qty 1

## 2014-06-03 MED ORDER — ONDANSETRON HCL 4 MG/2ML IJ SOLN
4.0000 mg | INTRAMUSCULAR | Status: DC | PRN
  Administered 2014-06-03 – 2014-06-05 (×7): 4 mg via INTRAVENOUS
  Filled 2014-06-03 (×7): qty 2

## 2014-06-03 MED ORDER — FENTANYL CITRATE 0.05 MG/ML IJ SOLN
INTRAMUSCULAR | Status: DC | PRN
  Administered 2014-06-03: 100 ug via INTRAVENOUS
  Administered 2014-06-03: 50 ug via INTRAVENOUS

## 2014-06-03 MED ORDER — MORPHINE SULFATE 2 MG/ML IJ SOLN
2.0000 mg | INTRAMUSCULAR | Status: DC | PRN
  Administered 2014-06-03 – 2014-06-05 (×9): 4 mg via INTRAVENOUS
  Filled 2014-06-03: qty 1
  Filled 2014-06-03 (×8): qty 2

## 2014-06-03 MED ORDER — PANTOPRAZOLE SODIUM 40 MG IV SOLR
40.0000 mg | INTRAVENOUS | Status: DC
  Administered 2014-06-03 – 2014-06-05 (×3): 40 mg via INTRAVENOUS
  Filled 2014-06-03 (×4): qty 40

## 2014-06-03 MED ORDER — NEOSTIGMINE METHYLSULFATE 10 MG/10ML IV SOLN
INTRAVENOUS | Status: DC | PRN
Start: 2014-06-03 — End: 2014-06-03
  Administered 2014-06-03: 5 mg via INTRAVENOUS

## 2014-06-03 MED ORDER — KETOROLAC TROMETHAMINE 30 MG/ML IJ SOLN
INTRAMUSCULAR | Status: AC
  Filled 2014-06-03: qty 1

## 2014-06-03 MED ORDER — CISATRACURIUM BESYLATE (PF) 10 MG/5ML IV SOLN
INTRAVENOUS | Status: DC | PRN
Start: 2014-06-03 — End: 2014-06-03
  Administered 2014-06-03: 4 mg via INTRAVENOUS
  Administered 2014-06-03: 6 mg via INTRAVENOUS

## 2014-06-03 MED ORDER — LACTATED RINGERS IR SOLN
Status: DC | PRN
  Administered 2014-06-03: 1000 mL

## 2014-06-03 MED ORDER — UNJURY VANILLA POWDER
2.0000 [oz_av] | Freq: Four times a day (QID) | ORAL | Status: DC
  Administered 2014-06-05: 8 [oz_av] via ORAL

## 2014-06-03 MED ORDER — SCOPOLAMINE 1 MG/3DAYS TD PT72
MEDICATED_PATCH | TRANSDERMAL | Status: DC | PRN
  Administered 2014-06-03: 1 via TRANSDERMAL

## 2014-06-03 MED ORDER — LACTATED RINGERS IV SOLN
INTRAVENOUS | Status: DC | PRN
  Administered 2014-06-03 (×2): via INTRAVENOUS

## 2014-06-03 MED ORDER — HEPARIN SODIUM (PORCINE) 5000 UNIT/ML IJ SOLN
5000.0000 [IU] | INTRAMUSCULAR | Status: AC
  Administered 2014-06-03: 5000 [IU] via SUBCUTANEOUS
  Filled 2014-06-03: qty 1

## 2014-06-03 MED ORDER — PHENYLEPHRINE 40 MCG/ML (10ML) SYRINGE FOR IV PUSH (FOR BLOOD PRESSURE SUPPORT)
PREFILLED_SYRINGE | INTRAVENOUS | Status: AC
Start: 2014-06-03 — End: 2014-06-03
  Filled 2014-06-03: qty 10

## 2014-06-03 MED ORDER — CHLORHEXIDINE GLUCONATE 4 % EX LIQD: 60.0000 mL | Freq: Once | CUTANEOUS | Status: DC

## 2014-06-03 MED ORDER — SUCCINYLCHOLINE CHLORIDE 20 MG/ML IJ SOLN
INTRAMUSCULAR | Status: DC | PRN
  Administered 2014-06-03: 100 mg via INTRAVENOUS

## 2014-06-03 MED ORDER — DEXTROSE 5 % IV SOLN
2.0000 g | INTRAVENOUS | Status: AC
  Administered 2014-06-03: 2 g via INTRAVENOUS

## 2014-06-03 MED ORDER — TISSEEL VH 10 ML EX KIT
PACK | CUTANEOUS | Status: DC | PRN
  Administered 2014-06-03: 20 mL

## 2014-06-03 MED ORDER — DEXAMETHASONE SODIUM PHOSPHATE 10 MG/ML IJ SOLN
INTRAMUSCULAR | Status: AC
  Filled 2014-06-03: qty 1

## 2014-06-03 MED ORDER — ONDANSETRON HCL 4 MG/2ML IJ SOLN
INTRAMUSCULAR | Status: DC | PRN
  Administered 2014-06-03: 4 mg via INTRAVENOUS

## 2014-06-03 MED ORDER — UNJURY CHOCOLATE CLASSIC POWDER
2.0000 [oz_av] | Freq: Four times a day (QID) | ORAL | Status: DC
  Administered 2014-06-04 – 2014-06-06 (×3): 2 [oz_av] via ORAL

## 2014-06-03 MED ORDER — BUPIVACAINE-EPINEPHRINE 0.25% -1:200000 IJ SOLN
INTRAMUSCULAR | Status: DC | PRN
  Administered 2014-06-03: 30 mL

## 2014-06-03 MED ORDER — PROMETHAZINE HCL 25 MG/ML IJ SOLN: 6.2500 mg | INTRAMUSCULAR | Status: DC | PRN

## 2014-06-03 MED ORDER — MIDAZOLAM HCL 2 MG/2ML IJ SOLN
INTRAMUSCULAR | Status: AC
  Filled 2014-06-03: qty 2

## 2014-06-03 MED ORDER — ACETAMINOPHEN 160 MG/5ML PO SOLN
650.0000 mg | ORAL | Status: DC | PRN
  Administered 2014-06-04: 650 mg via ORAL
  Filled 2014-06-03: qty 20.3

## 2014-06-03 MED ORDER — SCOPOLAMINE 1 MG/3DAYS TD PT72
MEDICATED_PATCH | TRANSDERMAL | Status: AC
  Filled 2014-06-03: qty 1

## 2014-06-03 MED ORDER — PROPOFOL 10 MG/ML IV BOLUS
INTRAVENOUS | Status: DC | PRN
  Administered 2014-06-03: 170 mg via INTRAVENOUS

## 2014-06-03 MED ORDER — EPHEDRINE SULFATE 50 MG/ML IJ SOLN
INTRAMUSCULAR | Status: AC
  Filled 2014-06-03: qty 1

## 2014-06-03 MED ORDER — PHENYLEPHRINE HCL 10 MG/ML IJ SOLN
INTRAMUSCULAR | Status: DC | PRN
Start: 2014-06-03 — End: 2014-06-03
  Administered 2014-06-03: 120 ug via INTRAVENOUS
  Administered 2014-06-03: 80 ug via INTRAVENOUS
  Administered 2014-06-03: 120 ug via INTRAVENOUS

## 2014-06-03 MED ORDER — BUPIVACAINE-EPINEPHRINE (PF) 0.25% -1:200000 IJ SOLN
INTRAMUSCULAR | Status: AC
  Filled 2014-06-03: qty 60

## 2014-06-03 MED ORDER — METOCLOPRAMIDE HCL 5 MG/ML IJ SOLN
INTRAMUSCULAR | Status: DC | PRN
  Administered 2014-06-03: 10 mg via INTRAVENOUS

## 2014-06-03 MED ORDER — ENOXAPARIN SODIUM 40 MG/0.4ML ~~LOC~~ SOLN
40.0000 mg | Freq: Two times a day (BID) | SUBCUTANEOUS | Status: DC
  Administered 2014-06-04 – 2014-06-06 (×5): 40 mg via SUBCUTANEOUS
  Filled 2014-06-03 (×7): qty 0.4

## 2014-06-03 MED ORDER — SODIUM CHLORIDE 0.9 % IJ SOLN
INTRAMUSCULAR | Status: AC
  Filled 2014-06-03: qty 10

## 2014-06-03 MED ORDER — CEFOXITIN SODIUM 2 G IV SOLR
INTRAVENOUS | Status: AC
  Filled 2014-06-03: qty 2

## 2014-06-03 MED ORDER — CISATRACURIUM BESYLATE 20 MG/10ML IV SOLN
INTRAVENOUS | Status: AC
  Filled 2014-06-03: qty 10

## 2014-06-03 MED ORDER — CHLORHEXIDINE GLUCONATE 0.12 % MT SOLN
15.0000 mL | Freq: Two times a day (BID) | OROMUCOSAL | Status: DC
  Administered 2014-06-04 – 2014-06-05 (×4): 15 mL via OROMUCOSAL
  Filled 2014-06-03 (×7): qty 15

## 2014-06-03 MED ORDER — 0.9 % SODIUM CHLORIDE (POUR BTL) OPTIME
TOPICAL | Status: DC | PRN
  Administered 2014-06-03: 1000 mL

## 2014-06-03 MED ORDER — EPHEDRINE SULFATE 50 MG/ML IJ SOLN
INTRAMUSCULAR | Status: DC | PRN
  Administered 2014-06-03: 10 mg via INTRAVENOUS

## 2014-06-03 MED ORDER — POTASSIUM CHLORIDE IN NACL 20-0.9 MEQ/L-% IV SOLN
INTRAVENOUS | Status: DC
  Administered 2014-06-03 – 2014-06-05 (×6): via INTRAVENOUS
  Administered 2014-06-05: 125 mL/h via INTRAVENOUS
  Administered 2014-06-05: 05:00:00 via INTRAVENOUS
  Filled 2014-06-03 (×11): qty 1000

## 2014-06-03 MED ORDER — NEOSTIGMINE METHYLSULFATE 10 MG/10ML IV SOLN
INTRAVENOUS | Status: AC
  Filled 2014-06-03: qty 1

## 2014-06-03 MED ORDER — PROPOFOL 10 MG/ML IV BOLUS
INTRAVENOUS | Status: AC
  Filled 2014-06-03: qty 20

## 2014-06-03 SURGICAL SUPPLY — 58 items
APPLICATOR COTTON TIP 6IN STRL (MISCELLANEOUS) IMPLANT
APPLIER CLIP ROT 10 11.4 M/L (STAPLE)
BLADE SURG SZ11 CARB STEEL (BLADE) ×3 IMPLANT
CABLE HIGH FREQUENCY MONO STRZ (ELECTRODE) IMPLANT
CHLORAPREP W/TINT 26ML (MISCELLANEOUS) ×3 IMPLANT
CLIP APPLIE ROT 10 11.4 M/L (STAPLE) IMPLANT
DERMABOND ADVANCED (GAUZE/BANDAGES/DRESSINGS) ×2
DERMABOND ADVANCED .7 DNX12 (GAUZE/BANDAGES/DRESSINGS) ×1 IMPLANT
DEVICE SUT QUICK LOAD TK 5 (STAPLE) ×2 IMPLANT
DEVICE SUT TI-KNOT TK 5X26 (MISCELLANEOUS) ×2 IMPLANT
DEVICE SUTURE ENDOST 10MM (ENDOMECHANICALS) ×3 IMPLANT
DEVICE TI KNOT TK5 (MISCELLANEOUS) ×1
DEVICE TROCAR PUNCTURE CLOSURE (ENDOMECHANICALS) ×3 IMPLANT
DRAPE CAMERA CLOSED 9X96 (DRAPES) ×3 IMPLANT
DRAPE UTILITY XL STRL (DRAPES) ×6 IMPLANT
ELECT REM PT RETURN 9FT ADLT (ELECTROSURGICAL) ×3
ELECTRODE REM PT RTRN 9FT ADLT (ELECTROSURGICAL) ×1 IMPLANT
GAUZE SPONGE 4X4 12PLY STRL (GAUZE/BANDAGES/DRESSINGS) IMPLANT
GLOVE BIOGEL M STRL SZ7.5 (GLOVE) ×3 IMPLANT
GOWN STRL REUS W/TWL XL LVL3 (GOWN DISPOSABLE) ×9 IMPLANT
HOVERMATT SINGLE USE (MISCELLANEOUS) ×3 IMPLANT
KIT BASIN OR (CUSTOM PROCEDURE TRAY) ×3 IMPLANT
NEEDLE SPNL 22GX3.5 QUINCKE BK (NEEDLE) ×3 IMPLANT
PACK UNIVERSAL I (CUSTOM PROCEDURE TRAY) ×3 IMPLANT
PEN SKIN MARKING BROAD (MISCELLANEOUS) ×3 IMPLANT
QUICK LOAD TK 5 (STAPLE) ×1
RELOAD STAPLER BLUE 60MM (STAPLE) ×3 IMPLANT
RELOAD STAPLER GOLD 60MM (STAPLE) IMPLANT
RELOAD STAPLER GREEN 60MM (STAPLE) ×2 IMPLANT
SCISSORS LAP 5X35 DISP (ENDOMECHANICALS) IMPLANT
SCISSORS LAP 5X45 EPIX DISP (ENDOMECHANICALS) ×3 IMPLANT
SEALANT SURGICAL APPL DUAL CAN (MISCELLANEOUS) ×3 IMPLANT
SET IRRIG TUBING LAPAROSCOPIC (IRRIGATION / IRRIGATOR) ×3 IMPLANT
SHEARS CURVED HARMONIC AC 45CM (MISCELLANEOUS) ×3 IMPLANT
SLEEVE ADV FIXATION 5X100MM (TROCAR) ×9 IMPLANT
SLEEVE GASTRECTOMY 36FR VISIGI (MISCELLANEOUS) ×3 IMPLANT
SLEEVE XCEL OPT CAN 5 100 (ENDOMECHANICALS) IMPLANT
SOLUTION ANTI FOG 6CC (MISCELLANEOUS) ×3 IMPLANT
STAPLE ECHEON FLEX 60 POW ENDO (STAPLE) IMPLANT
STAPLER ECHELON LONG 60 440 (INSTRUMENTS) ×3 IMPLANT
STAPLER RELOAD BLUE 60MM (STAPLE) ×9
STAPLER RELOAD GOLD 60MM (STAPLE)
STAPLER RELOAD GREEN 60MM (STAPLE) ×6
SUT MNCRL AB 4-0 PS2 18 (SUTURE) ×6 IMPLANT
SUT SURGIDAC NAB ES-9 0 48 120 (SUTURE) ×3 IMPLANT
SUT VICRYL 0 TIES 12 18 (SUTURE) ×3 IMPLANT
SYRINGE 20CC LL (MISCELLANEOUS) ×3 IMPLANT
SYRINGE 60CC LL (MISCELLANEOUS) ×3 IMPLANT
TOWEL OR 17X26 10 PK STRL BLUE (TOWEL DISPOSABLE) ×3 IMPLANT
TOWEL OR NON WOVEN STRL DISP B (DISPOSABLE) ×3 IMPLANT
TRAY FOLEY CATH 14FRSI W/METER (CATHETERS) IMPLANT
TROCAR ADV FIXATION 5X100MM (TROCAR) ×3 IMPLANT
TROCAR BLADELESS 15MM (ENDOMECHANICALS) IMPLANT
TROCAR BLADELESS OPT 5 100 (ENDOMECHANICALS) ×3 IMPLANT
TUBING CONNECTING 10 (TUBING) ×2 IMPLANT
TUBING CONNECTING 10' (TUBING) ×1
TUBING ENDO SMARTCAP (MISCELLANEOUS) ×3 IMPLANT
TUBING FILTER THERMOFLATOR (ELECTROSURGICAL) ×3 IMPLANT

## 2014-06-03 NOTE — Transfer of Care (Signed)
Immediate Anesthesia Transfer of Care Note  Patient: Brittany Neal  Procedure(s) Performed: Procedure(s): LAPAROSCOPIC GASTRIC SLEEVE RESECTION WITH HIATAL HERNIA  REPAIR (N/A)  Patient Location: PACU  Anesthesia Type:General  Level of Consciousness: awake, sedated and patient cooperative  Airway & Oxygen Therapy: Patient Spontanous Breathing and Patient connected to face mask oxygen  Post-op Assessment: Report given to PACU RN and Post -op Vital signs reviewed and stable  Post vital signs: Reviewed and stable  Complications: No apparent anesthesia complications

## 2014-06-03 NOTE — Anesthesia Preprocedure Evaluation (Signed)
Anesthesia Evaluation  Patient identified by MRN, date of birth, ID band Patient awake    Reviewed: Allergy & Precautions, H&P , NPO status , Patient's Chart, lab work & pertinent test results  Airway Mallampati: II TM Distance: <3 FB Neck ROM: Full    Dental no notable dental hx.    Pulmonary sleep apnea ,  breath sounds clear to auscultation  Pulmonary exam normal       Cardiovascular negative cardio ROS  Rhythm:Regular Rate:Normal     Neuro/Psych negative neurological ROS  negative psych ROS   GI/Hepatic negative GI ROS, Neg liver ROS,   Endo/Other  Morbid obesity  Renal/GU negative Renal ROS  negative genitourinary   Musculoskeletal negative musculoskeletal ROS (+)   Abdominal   Peds negative pediatric ROS (+)  Hematology negative hematology ROS (+)   Anesthesia Other Findings   Reproductive/Obstetrics negative OB ROS                           Anesthesia Physical Anesthesia Plan  ASA: II  Anesthesia Plan: General   Post-op Pain Management:    Induction: Intravenous  Airway Management Planned: Oral ETT  Additional Equipment:   Intra-op Plan:   Post-operative Plan:   Informed Consent: I have reviewed the patients History and Physical, chart, labs and discussed the procedure including the risks, benefits and alternatives for the proposed anesthesia with the patient or authorized representative who has indicated his/her understanding and acceptance.   Dental advisory given  Plan Discussed with: CRNA and Surgeon  Anesthesia Plan Comments:         Anesthesia Quick Evaluation

## 2014-06-03 NOTE — H&P (View-Only) (Signed)
Patient ID: Brittany Neal, female   DOB: 04/11/1961, 52 y.o.   MRN: [id number]  Chief Complaint  Patient presents with  . Bariatric Follow Up  . Bariatric Pre-op    HPI Brittany Neal is a 52 y.o. female.   HPI 52-year-old Caucasian female comes in today for her preoperative appointment. She is currently scheduled to undergo a laparoscopic sleeve gastrectomy with possible hiatal hernia repair. I initially met her in April of this year. Her weight at that time was 243.6 pounds. She is completed her preoperative workup. She denies any changes since she was last seen except for some left shoulder soreness. Past Medical History  Diagnosis Date  . CIN II (cervical intraepithelial neoplasia II)   . Depression   . Anxiety   . ADHD (attention deficit hyperactivity disorder)     Past Surgical History  Procedure Laterality Date  . Knee surgery Left 2000  . Dilation and curettage of uterus  2007  . Abdominal hysterectomy  2007    Family History  Problem Relation Age of Onset  . Heart disease Sister     MV  . Colon cancer Neg Hx   . Stroke Other   . Obesity Other     Social History History  Substance Use Topics  . Smoking status: Never Smoker   . Smokeless tobacco: Never Used  . Alcohol Use: 0.6 oz/week    1 Glasses of wine per week    Allergies  Allergen Reactions  . Percocet [Oxycodone-Acetaminophen] Itching    Current Outpatient Prescriptions  Medication Sig Dispense Refill  . buPROPion (WELLBUTRIN XL) 300 MG 24 hr tablet Take 300 mg by mouth daily.      . topiramate (TOPAMAX) 100 MG tablet Take 100 mg by mouth 2 (two) times daily.      . oxyCODONE (ROXICODONE) 5 MG/5ML solution Take 5-10 mLs (5-10 mg total) by mouth every 4 (four) hours as needed for severe pain.  200 mL  0   No current facility-administered medications for this visit.    Review of Systems Review of Systems  Constitutional: Negative for fever, activity change, appetite change and unexpected weight  change.  HENT: Negative for nosebleeds and trouble swallowing.   Eyes: Negative for photophobia and visual disturbance.  Respiratory: Negative for chest tightness and shortness of breath.   Cardiovascular: Negative for chest pain and leg swelling.       Denies CP, SOB, orthopnea, PND, DOE  Gastrointestinal: Negative for nausea, vomiting, abdominal pain, diarrhea and constipation.       Denies reflux  Genitourinary: Negative for dysuria and difficulty urinating.  Musculoskeletal: Negative for arthralgias.       Bilateral knee pain  Skin: Negative for pallor and rash.  Neurological: Negative for dizziness, seizures, facial asymmetry and numbness.       Denies TIA and amaurosis fugax   Hematological: Negative for adenopathy. Does not bruise/bleed easily.  Psychiatric/Behavioral: Negative for behavioral problems and agitation.    Blood pressure 126/72, pulse 88, temperature 98.1 F (36.7 C), resp. rate 16, height 5' 3" (1.6 m), weight 241 lb (109.317 kg).  Physical Exam Physical Exam  Vitals reviewed. Constitutional: She is oriented to person, place, and time. She appears well-developed and well-nourished. No distress.  Morbidly obese  HENT:  Head: Normocephalic and atraumatic.  Right Ear: External ear normal.  Left Ear: External ear normal.  Eyes: Conjunctivae are normal. No scleral icterus.  Neck: Normal range of motion. Neck supple. No tracheal deviation present.   No thyromegaly present.  Cardiovascular: Normal rate and normal heart sounds.   Pulmonary/Chest: Effort normal and breath sounds normal. No stridor. No respiratory distress. She has no wheezes.  Abdominal: Soft. She exhibits no distension. There is no tenderness. There is no rebound and no guarding.    Musculoskeletal: She exhibits no edema and no tenderness.  Lymphadenopathy:    She has no cervical adenopathy.  Neurological: She is alert and oriented to person, place, and time. She exhibits normal muscle tone.    Skin: Skin is warm and dry. No rash noted. She is not diaphoretic. No erythema.  Psychiatric: She has a normal mood and affect. Her behavior is normal. Judgment and thought content normal.    Data Reviewed My note Dr Clance's office note - occ resp events with sleep disturbance abd u/s - L hepatic lobe with increased echogencity suggestive of steatosis. ugi - small sliding hiatal hernia  Assessment    Morbid obesity BMI 42.69 Mild OSA (no cpap needed per pulm) Depression Vit D def  Hepatic steatosis Migraines Knee pain  Small sliding hiatal hernia    Plan    We reviewed her workup to date. Her initial evaluation labs did not reveal anything surprising other than some vitamin D deficiency. We reviewed the typical surgical course and postoperative course. We discussed the finding of a small hiatal hernia on her upper GI. However she does not have any reflux. I explained that we would test for a hiatal hernia during surgery with the balloon calibration tube. I recommended hiatal hernia if She was found to have a clinically significant defect during surgery. We discussed what that would entail. All of her questions were asked and answered. We discussed the importance of the preoperative diet. We discussed the importance of walking daily prior to surgery as well as after surgery. She was given her postoperative pain medicine prescription today. All of her questions were asked and answered.  Note: This dictation was prepared with Dragon/digital dictation along with Smartphrase technology. Any transcriptional errors that result from this process are unintentional.  Emmerson Taddei M. Keyonna Comunale, MD, FACS General, Bariatric, & Minimally Invasive Surgery [facility]        Marlo Arriola M 05/17/2014, 5:32 AM    

## 2014-06-03 NOTE — Op Note (Signed)
06/03/2014 Brittany Neal [birth year] [id number]   PRE-OPERATIVE DIAGNOSIS:   Morbid obesity BMI 42.69  Mild OSA (no cpap needed per pulm)  Depression  Vit D def  Hepatic steatosis  Migraines  Knee pain  sliding hiatal hernia   POST-OPERATIVE DIAGNOSIS:  same  PROCEDURE:  Procedure(s): LAPAROSCOPIC SLEEVE GASTRECTOMY with laparoscopic hiatal hernia repair UPPER GI ENDOSCOPY  SURGEON:  Surgeon(s): Atilano Ina, MD FACS  ASSISTANTS: Luretha Murphy, MD FACS  ANESTHESIA:   general  DRAINS: none   BOUGIE: 36 fr ViSiGi  LOCAL MEDICATIONS USED:  MARCAINE     SPECIMEN:  Source of Specimen:  Greater curvature of stomach  DISPOSITION OF SPECIMEN:  PATHOLOGY  COUNTS:  YES  INDICATION FOR PROCEDURE: This is a very pleasant 53 year old morbidly obese WF who has had unsuccessful attempts for sustained weight loss. She presents today for a planned laparoscopic sleeve gastrectomy with possible hiatal hernia repair with upper endoscopy. We have discussed the risk and benefits of the procedure extensively preoperatively. Please see my separate notes.  PROCEDURE: After obtaining informed consent and receiving 5000 units of subcutaneous heparin, the patient was brought to the operating room at [facility] and placed supine on the operating room table. General endotracheal anesthesia was established. Sequential compression devices were placed. A orogastric tube was placed. The patient's abdomen was prepped and draped in the usual standard surgical fashion. She received preoperative IV antibiotics. A surgical timeout was performed.  Access to the abdomen was achieved using a 5 mm 0 laparoscope thru a 5 mm trocar In the left upper Quadrant 2 fingerbreadths below the left subcostal margin using the Optiview technique. Pneumoperitoneum was smoothly established up to 15 mm of mercury. The laparoscope was advanced and the abdominal cavity was surveilled. There were no unusual findings on  laparoscopy.  A 5 mm trocar was placed slightly above and to the left of the umbilicus under direct visualization. The patient was then placed in reverse Trendelenburg. The [facility] liver retractor was placed under the left lobe of the liver through a 5 mm trocar incision site in the subxiphoid position. A 5 mm trocar was placed in the lateral right upper quadrant along with a 15 mm trocar in the mid right abdomen  All under direct visualization after local had been infiltrated.  The stomach was inspected. It was completely decompressed and the orogastric tube was removed. She did have a small anterior dimple. I had CRNA pass the calibration tube down into the stomach. 10 cc of air was inflated into the balloon on the calibration tubing. We then had the CRNA gently pulled back on the calibration tubing and the balloon slid easily past the GE junction. The balloon was deflated and the calibration tubing was removed from the abdomen. At this point I decided to dissect out the left and right crus in order to repair the hiatal hernia. The gastrohepatic ligament was incised with the electrocautery. The right crus was identified and incised with electrocautery. Then I bluntly dissected the left and right crus out. There was a gap between the 2. Using a 0 Ethibond Endo Stitch I reapproximated the left and right crus. It was secured with a titanium tie knot. We identified the pylorus and measured 6 cm proximal to the pylorus and identified an area of where we would start taking down the short gastric vessels. Harmonic scalpel was used to take down the short gastric vessels along the greater curvature of the stomach. We were able to enter  the lesser sac. We continued to march along the greater curvature of the stomach taking down the short gastrics. As we approached the gastrosplenic ligament we took care in this area not to injure the spleen. We were able to take down the entire gastrosplenic ligament. We then mobilized  the fundus away from the left crus of diaphragm. There were a few posterior gastric avascular attachments which were taken down. This left the stomach completely mobilized. No vessels had been taken down along the lesser curvature of the stomach.  We then reidentified the pylorus. A 36Fr ViSiGi was then placed in the oropharynx and advanced down into the stomach and placed in the distal antrum and positioned along the lesser curvature. It was placed under suction which secured the 36Fr ViSiGi in place along the lesser curve. Then using the Ethicon echelon 60 mm stapler with a green load, I placed a stapler along the antrum approximately 6 cm from the pylorus. The stapler was angled so that there is ample room at the angularis incisura. I then fired the first staple load after inspecting it posteriorly to ensure adequate space both anteriorly and posteriorly. At this point I still was not completely past the angularis so with another green load, I placed the stapler in position just inside the prior stapleline. We then rotated the stomach to insure that there was adequate anteriorly as well as posteriorly. The stapler was then fired. At this point I started using blue load staple cartridges. The echelon stapler was then repositioned with a 60 mm blue load and we continued to march up along the ViSiGi. My assistant was holding traction along the greater curvature stomach along the cauterized short gastric vessels ensuring that the stomach was symmetrically retracted. Prior to each firing of the staple, we rotated the stomach to ensure that there is adequate stomach left.  As we approached the fundus, the last firing of the stapler was lateral to the esophageal fat pad. Although the staples on this fire had completely gone thru the last part of the stomach it had not completely cut it. Therefore 1 additional 60 blue load was used to free the remaining stomach. The sleeve was inspected. There is no evidence of cork  screw. The staple line appeared hemostatic. The CRNA inflated the ViSiGi to the green zone and the upper abdomen was flooded with saline. There were no bubbles. The sleeve was decompressed and the ViSiGi removed. My assistant scrubbed out and performed an upper endoscopy. The sleeve easily distended with air and the scope was easily advanced to the pylorus. There is no evidence of internal bleeding or cork screwing. There was no narrowing at the angularis. There is no evidence of bubbles. Please see his operative note for further details. The gastric sleeve was decompressed and the endoscope was removed. Tisseel tissue sealant was applied along the entire length of the staple line. The greater curvature the stomach was grasped with a laparoscopic grasper and removed from the 15 mm trocar site.  The liver retractor was removed. I then closed the 15 mm trocar site with 2 interrupted 0 Vicryl sutures through the fascia using the endoclose. The closure was viewed laparoscopically and it was airtight. Pneumoperitoneum was released. All trocar sites were closed with a 4-0 Monocryl in a subcuticular fashion followed by the application of Dermabond. The patient was extubated and taken to the recovery room in stable condition. All needle, instrument, and sponge counts were correct x2. There are no immediate complications  PLAN OF CARE: Admit to inpatient   PATIENT DISPOSITION:  PACU - hemodynamically stable.   Delay start of Pharmacological VTE agent (>24hrs) due to surgical blood loss or risk of bleeding:  no  Brittany Neal. Andrey Campanile, MD, FACS General, Bariatric, & Minimally Invasive Surgery [facility], [place]

## 2014-06-03 NOTE — Progress Notes (Deleted)
Telephone order received from MD Thomas and Wilson that patient may transfer to Telemetry floor for monitoring, and per family request Vaden Becherer N RN 06-03-2014 17:36pm 

## 2014-06-03 NOTE — Interval H&P Note (Signed)
History and Physical Interval Note:  06/03/2014 7:14 AM  Brittany Neal  has presented today for surgery, with the diagnosis of Morbid Obesity  The various methods of treatment have been discussed with the patient and family. After consideration of risks, benefits and other options for treatment, the patient has consented to  Procedure(s): LAPAROSCOPIC GASTRIC SLEEVE RESECTION (N/A) as a surgical intervention .  The patient's history has been reviewed, patient examined, no change in status, stable for surgery.  I have reviewed the patient's chart and labs.  Questions were answered to the patient's satisfaction.    Mary SellaEric M. Andrey CampanileWilson, MD, FACS General, Bariatric, & Minimally Invasive Surgery [facility] [place]   Gibson Community HospitalWILSON,[name]

## 2014-06-03 NOTE — Anesthesia Postprocedure Evaluation (Signed)
  Anesthesia Post-op Note  Patient: Brittany Neal  Procedure(s) Performed: Procedure(s) (LRB): LAPAROSCOPIC GASTRIC SLEEVE RESECTION WITH HIATAL HERNIA  REPAIR (N/A)  Patient Location: PACU  Anesthesia Type: General  Level of Consciousness: awake and alert   Airway and Oxygen Therapy: Patient Spontanous Breathing  Post-op Pain: mild  Post-op Assessment: Post-op Vital signs reviewed, Patient's Cardiovascular Status Stable, Respiratory Function Stable, Patent Airway and No signs of Nausea or vomiting  Last Vitals:  Filed Vitals:   06/03/14 0945  BP: 131/74  Pulse: 85  Temp:   Resp: 22    Post-op Vital Signs: stable   Complications: No apparent anesthesia complications

## 2014-06-04 ENCOUNTER — Inpatient Hospital Stay (HOSPITAL_COMMUNITY): Payer: BC Managed Care – PPO

## 2014-06-04 ENCOUNTER — Encounter (HOSPITAL_COMMUNITY): Payer: Self-pay | Admitting: General Surgery

## 2014-06-04 LAB — CBC WITH DIFFERENTIAL/PLATELET
Basophils Absolute: 0 10*3/uL (ref 0.0–0.1)
Basophils Relative: 0 % (ref 0–1)
Eosinophils Absolute: 0 10*3/uL (ref 0.0–0.7)
Eosinophils Relative: 0 % (ref 0–5)
HEMATOCRIT: 38.3 % (ref 36.0–46.0)
Hemoglobin: 12.6 g/dL (ref 12.0–15.0)
LYMPHS PCT: 11 % — AB (ref 12–46)
Lymphs Abs: 1.3 10*3/uL (ref 0.7–4.0)
MCH: 28.1 pg (ref 26.0–34.0)
MCHC: 32.9 g/dL (ref 30.0–36.0)
MCV: 85.5 fL (ref 78.0–100.0)
MONO ABS: 1 10*3/uL (ref 0.1–1.0)
MONOS PCT: 8 % (ref 3–12)
NEUTROS ABS: 10.2 10*3/uL — AB (ref 1.7–7.7)
Neutrophils Relative %: 81 % — ABNORMAL HIGH (ref 43–77)
Platelets: 216 10*3/uL (ref 150–400)
RBC: 4.48 MIL/uL (ref 3.87–5.11)
RDW: 14 % (ref 11.5–15.5)
WBC: 12.5 10*3/uL — AB (ref 4.0–10.5)

## 2014-06-04 LAB — COMPREHENSIVE METABOLIC PANEL
ALT: 24 U/L (ref 0–35)
AST: 27 U/L (ref 0–37)
Albumin: 3.3 g/dL — ABNORMAL LOW (ref 3.5–5.2)
Alkaline Phosphatase: 67 U/L (ref 39–117)
Anion gap: 10 (ref 5–15)
BUN: 7 mg/dL (ref 6–23)
CO2: 22 mEq/L (ref 19–32)
Calcium: 8.8 mg/dL (ref 8.4–10.5)
Chloride: 107 mEq/L (ref 96–112)
Creatinine, Ser: 0.65 mg/dL (ref 0.50–1.10)
GFR calc Af Amer: >90 mL/min (ref >90)
GFR calc non Af Amer: >90 mL/min (ref >90)
Glucose, Bld: 109 mg/dL — ABNORMAL HIGH (ref 70–99)
Potassium: 4.3 mEq/L (ref 3.7–5.3)
Sodium: 139 mEq/L (ref 137–147)
Total Bilirubin: 0.5 mg/dL (ref 0.3–1.2)
Total Protein: 6.3 g/dL (ref 6.0–8.3)

## 2014-06-04 LAB — HEMOGLOBIN AND HEMATOCRIT, BLOOD
HCT: 38.1 % (ref 36.0–46.0)
Hemoglobin: 12.8 g/dL (ref 12.0–15.0)

## 2014-06-04 MED ORDER — IOHEXOL 300 MG/ML  SOLN
50.0000 mL | Freq: Once | INTRAMUSCULAR | Status: AC | PRN
  Administered 2014-06-04: 50 mL via ORAL

## 2014-06-04 MED ORDER — ALUM & MAG HYDROXIDE-SIMETH 200-200-20 MG/5ML PO SUSP: 15.0000 mL | Freq: Four times a day (QID) | ORAL | Status: DC | PRN

## 2014-06-04 MED ORDER — HYDROCODONE-ACETAMINOPHEN 7.5-325 MG/15ML PO SOLN
5.0000 mL | ORAL | Status: DC | PRN
  Administered 2014-06-04: 5 mL via ORAL
  Administered 2014-06-05 (×3): 10 mL via ORAL
  Filled 2014-06-04 (×4): qty 15

## 2014-06-04 NOTE — Progress Notes (Signed)
1 Day Post-Op  Subjective: Had rt neck/shoulder pain yesterday and lots of nausea. All of that better today. Now just with a HA. Minimal abd pain -some rt sided pain. Walked several times already  Objective: Vital signs in last 24 hours: Temp:  [97.4 F (36.3 C)-99.3 F (37.4 C)] 98.9 F (37.2 C) (08/18 0642) Pulse Rate:  [81-98] 87 (08/18 0520) Resp:  [16-23] 18 (08/18 0520) BP: (97-144)/(60-82) 106/60 mmHg (08/18 0520) SpO2:  [92 %-99 %] 94 % (08/18 0642) Weight:  [239 lb 6.4 oz (108.591 kg)] 239 lb 6.4 oz (108.591 kg) (08/17 1120) Last BM Date: 06/02/14 (PTA)  Intake/Output from previous day: 08/17 0701 - 08/18 0700 In: 4075 [I.V.:4075] Out: 2900 [Urine:2900] Intake/Output this shift:    Alert, nontoxic, smiling cta b/l Reg Soft, obese, approp TTP, incisions ok No edema. +SCDs  Lab Results:   Recent Labs  06/03/14 1244 06/04/14 0500  WBC  --  12.5*  HGB 13.8 12.6  HCT 41.1 38.3  PLT  --  216   BMET  Recent Labs  06/04/14 0500  NA 139  K 4.3  CL 107  CO2 22  GLUCOSE 109*  BUN 7  CREATININE 0.65  CALCIUM 8.8   PT/INR No results found for this basename: LABPROT, INR,  in the last 72 hours ABG No results found for this basename: PHART, PCO2, PO2, HCO3,  in the last 72 hours  Studies/Results: No results found.  Anti-infectives: Anti-infectives   Start     Dose/Rate Route Frequency Ordered Stop   06/03/14 0509  cefOXitin (MEFOXIN) 2 g in dextrose 5 % 50 mL IVPB     2 g 100 mL/hr over 30 Minutes Intravenous On call to O.R. 06/03/14 0509 06/03/14 0715      Assessment/Plan: s/p Procedure(s): LAPAROSCOPIC GASTRIC SLEEVE RESECTION WITH HIATAL HERNIA  REPAIR (N/A)  No fever, tachycardia For UGI this am, if ok will adv to POD 1 diet Cont VTE prophylaxis - lovenox, ambulation, scds pulm toilet, IS  Looks good  Mary SellaEric M. Andrey CampanileWilson, MD, FACS General, Bariatric, & Minimally Invasive Surgery [facility], [place]   LOS: 1 day    Atilano InaWILSON,Reilyn Nelson  M 06/04/2014

## 2014-06-04 NOTE — Progress Notes (Signed)
Patient alert and oriented, Post op day 1.  Provided support and encouragement.  Encouraged pulmonary toilet, ambulation and small sips of liquids.  All questions answered.  Will continue to monitor. 

## 2014-06-04 NOTE — Progress Notes (Signed)
Nutrition Education Note  Received consult for diet education per DROP protocol.   Discussed 2 week post op diet with pt. Emphasized that liquids must be non carbonated, non caffeinated, and sugar free. Fluid goals discussed. Pt to follow up with outpatient bariatric RD for further diet progression after 2 weeks. Multivitamins and minerals also reviewed. Teach back method used, pt expressed understanding, expect good compliance. Pt c/o headache, notified RN. Denies any nausea.    Diet: First 2 Weeks  You will see the nutritionist about two (2) weeks after your surgery. The nutritionist will increase the types of foods you can eat if you are handling liquids well:  If you have severe vomiting or nausea and cannot handle clear liquids lasting longer than 1 day, call your surgeon  Protein Shake  Drink at least 2 ounces of shake 5-6 times per day  Each serving of protein shakes (usually 8 - 12 ounces) should have a minimum of:  15 grams of protein  And no more than 5 grams of carbohydrate  Goal for protein each day:  Men = 80 grams per day  Women = 60 grams per day  Protein powder may be added to fluids such as non-fat milk or Lactaid milk or Soy milk (limit to 35 grams added protein powder per serving)   Hydration  Slowly increase the amount of water and other clear liquids as tolerated (See Acceptable Fluids)  Slowly increase the amount of protein shake as tolerated  Sip fluids slowly and throughout the day  May use sugar substitutes in small amounts (no more than 6 - 8 packets per day; i.e. Splenda)   Fluid Goal  The first goal is to drink at least 8 ounces of protein shake/drink per day (or as directed by the nutritionist); some examples of protein shakes are [employer] IndustriesSyntrax Nectar, Dillard'sdkins Advantage, EAS Edge HP, and Unjury. See handout from pre-op Bariatric Education Class:  Slowly increase the amount of protein shake you drink as tolerated  You may find it easier to slowly sip shakes  throughout the day  It is important to get your proteins in first  Your fluid goal is to drink 64 - 100 ounces of fluid daily  It may take a few weeks to build up to this  32 oz (or more) should be clear liquids  And  32 oz (or more) should be full liquids (see below for examples)  Liquids should not contain sugar, caffeine, or carbonation   Clear Liquids:  Water or Sugar-free flavored water (i.e. Fruit H2O, Propel)  Decaffeinated coffee or tea (sugar-free)  Crystal Lite, Wyler's Lite, Minute Maid Lite  Sugar-free Jell-O  Bouillon or broth  Sugar-free Popsicle: *Less than 20 calories each; Limit 1 per day   Full Liquids:  Protein Shakes/Drinks + 2 choices per day of other full liquids  Full liquids must be:  No More Than 12 grams of Carbs per serving  No More Than 3 grams of Fat per serving  Strained low-fat cream soup  Non-Fat milk  Fat-free Lactaid Milk  Sugar-free yogurt (Dannon Lite & Fit, Greek yogurt)     Indian WellsHeather Raza Bayless MS, RD, [place] [phone] Pager [phone] Weekend/After Hours Pager

## 2014-06-04 NOTE — Care Management Note (Signed)
    Page 1 of 1   06/04/2014     12:07:11 PM CARE MANAGEMENT NOTE 06/04/2014  Patient:  Brittany Neal,Brittany Neal   Account Number:  [id number]  Date Initiated:  06/04/2014  Documentation initiated by:  Lorenda IshiharaPEELE,Damek Ende  Subjective/Objective Assessment:   53 yo female admitted s/p sleeve gastrectomy. PTA lived at home with spouse.     Action/Plan:   Home when stable   Anticipated DC Date:  06/07/2014   Anticipated DC Plan:  HOME/SELF CARE      DC Planning Services  CM consult      Choice offered to / List presented to:             Status of service:  Completed, signed off Medicare Important Message given?   (If response is "NO", the following Medicare IM given date fields will be blank) Date Medicare IM given:   Medicare IM given by:   Date Additional Medicare IM given:   Additional Medicare IM given by:    Discharge Disposition:  HOME/SELF CARE  Per UR Regulation:  Reviewed for med. necessity/level of care/duration of stay  If discussed at Long Length of Stay Meetings, dates discussed:    Comments:

## 2014-06-05 LAB — CBC WITH DIFFERENTIAL/PLATELET
BASOS ABS: 0 10*3/uL (ref 0.0–0.1)
BASOS PCT: 0 % (ref 0–1)
Eosinophils Absolute: 0.2 10*3/uL (ref 0.0–0.7)
Eosinophils Relative: 2 % (ref 0–5)
HCT: 37.9 % (ref 36.0–46.0)
Hemoglobin: 12.5 g/dL (ref 12.0–15.0)
Lymphocytes Relative: 20 % (ref 12–46)
Lymphs Abs: 1.9 10*3/uL (ref 0.7–4.0)
MCH: 28.5 pg (ref 26.0–34.0)
MCHC: 33 g/dL (ref 30.0–36.0)
MCV: 86.3 fL (ref 78.0–100.0)
Monocytes Absolute: 0.7 10*3/uL (ref 0.1–1.0)
Monocytes Relative: 7 % (ref 3–12)
NEUTROS ABS: 6.6 10*3/uL (ref 1.7–7.7)
NEUTROS PCT: 71 % (ref 43–77)
PLATELETS: 215 10*3/uL (ref 150–400)
RBC: 4.39 MIL/uL (ref 3.87–5.11)
RDW: 14.1 % (ref 11.5–15.5)
WBC: 9.3 10*3/uL (ref 4.0–10.5)

## 2014-06-05 MED ORDER — HYDROCODONE-ACETAMINOPHEN 7.5-325 MG/15ML PO SOLN: 5.0000 mL | ORAL | Status: DC | PRN

## 2014-06-05 NOTE — Progress Notes (Signed)
Patient alert and oriented, pain is controlled. Patient is having difficulty tolerating fluids, advanced to protein shake last night, but continues to have issues with nausea.  Patient was able to tolerate 4oz of protein shake but with significant nausea and dry heaves. Reviewed Gastric sleeve discharge instructions with patient and patient is able to articulate understanding.  Provided information on BELT program, Support Group and [facility] outpatient pharmacy. All questions answered, will continue to monitor.

## 2014-06-05 NOTE — Progress Notes (Signed)
2 Days Post-Op  Subjective: HA getting better. Nausea yesterday but able to get water and protein shake. Nothing came up. Some Rt abd wall soreness. Warm liquids tolerated better.  Objective: Vital signs in last 24 hours: Temp:  [97.8 F (36.6 C)-99.2 F (37.3 C)] 97.8 F (36.6 C) (08/19 0538) Pulse Rate:  [73-77] 77 (08/19 0538) Resp:  [18-20] 18 (08/19 0538) BP: (122-134)/(62-73) 130/70 mmHg (08/19 0538) SpO2:  [90 %-98 %] 90 % (08/19 0538) Last BM Date: 06/02/14 (PTA)  Intake/Output from previous day: 08/18 0701 - 08/19 0700 In: 2714.6 [I.V.:2714.6] Out: 1600 [Urine:1600] Intake/Output this shift:    Alert, nontoxic cta b/l Reg Obese, soft, incisions c/d/i. Exp TTP No edema, +SCDs  Lab Results:   Recent Labs  06/04/14 0500 06/04/14 1600 06/05/14 0500  WBC 12.5*  --  9.3  HGB 12.6 12.8 12.5  HCT 38.3 38.1 37.9  PLT 216  --  215   BMET  Recent Labs  06/04/14 0500  NA 139  K 4.3  CL 107  CO2 22  GLUCOSE 109*  BUN 7  CREATININE 0.65  CALCIUM 8.8   PT/INR No results found for this basename: LABPROT, INR,  in the last 72 hours ABG No results found for this basename: PHART, PCO2, PO2, HCO3,  in the last 72 hours  Studies/Results: Dg Ugi W/water Sol Cm  06/04/2014   CLINICAL DATA:  Postop gastric sleeve resection yesterday.  EXAM: WATER SOLUBLE UPPER GI SERIES  TECHNIQUE: Single-column upper GI series was performed using water soluble contrast.  CONTRAST:  50mL OMNIPAQUE IOHEXOL 300 MG/ML  SOLN  COMPARISON:  Preoperative upper GI series 02/19/2014.  FLUOROSCOPY TIME:  22 seconds.  FINDINGS: The scout abdominal radiograph demonstrates no abnormality. Patient swallowed the contrast without difficulty. The esophageal motility is normal. The gastric fundus filled normally. There are subsequent passage of contrast into the partitioned stomach and duodenum. No extravasation was identified. A small amount of reflux was noted during the examination.  IMPRESSION: No  demonstrated complication following gastric sleeve resection.   Electronically Signed   By: Roxy HorsemanBill  Veazey M.D.   On: 06/04/2014 09:16    Anti-infectives: Anti-infectives   Start     Dose/Rate Route Frequency Ordered Stop   06/03/14 0509  cefOXitin (MEFOXIN) 2 g in dextrose 5 % 50 mL IVPB     2 g 100 mL/hr over 30 Minutes Intravenous On call to O.R. 06/03/14 0509 06/03/14 0715      Assessment/Plan: s/p Procedure(s): LAPAROSCOPIC GASTRIC SLEEVE RESECTION WITH HIATAL HERNIA  REPAIR (N/A)  Nausea not uncommon but appears to be tolerating po Afebrile, nontachycardia - no clinical signs of leak  Will check on later this am and determine if po adequate for discharge Discussed d/c instructions  Mary SellaEric M. Andrey CampanileWilson, MD, FACS General, Bariatric, & Minimally Invasive Surgery [facility], [place]   LOS: 2 days    Atilano InaWILSON,Zayana Salvador M 06/05/2014

## 2014-06-05 NOTE — Discharge Instructions (Signed)

## 2014-06-06 MED ORDER — ONDANSETRON HCL 4 MG PO TABS: 4.0000 mg | ORAL_TABLET | Freq: Three times a day (TID) | ORAL | Status: DC | PRN

## 2014-06-06 MED ORDER — PANTOPRAZOLE SODIUM 40 MG PO TBEC: 40.0000 mg | DELAYED_RELEASE_TABLET | Freq: Every day | ORAL | Status: DC

## 2014-06-06 NOTE — Discharge Summary (Signed)
Physician Discharge Summary  Brittany Neal ZOX:[id number] DOB: November 25, 1960 DOA: 06/03/2014  PCP: Neldon Labella, MD  Admit date: 06/03/2014 Discharge date: 06/06/2014  Recommendations for Outpatient Follow-up:   Follow-up Information   Follow up with Atilano Ina, MD On 06/20/2014. (2:45 PM, For wound re-check)    Specialty:  General Surgery   Contact information:   [address] [place] [address] [phone]      Discharge Diagnoses:  Active Problems:   S/P laparoscopic sleeve gastrectomy Principal Problem:   S/P laparoscopic sleeve gastrectomy Active Problems:   Depression   OSA (obstructive sleep apnea)   Obesity, Class III, BMI 40-49.9 (morbid obesity)   Surgical Procedure: Laparoscopic Sleeve Gastrectomy, upper endoscopy  Discharge Condition: Good Disposition: Home  Diet recommendation: Postoperative sleeve gastrectomy diet (liquids only)  Filed Weights   06/03/14 0512 06/03/14 1120  Weight: 239 lb 4 oz (108.523 kg) 239 lb 6.4 oz (108.591 kg)     Hospital Course:  The patient was admitted for a planned laparoscopic sleeve gastrectomy. Please see operative note. Preoperatively the patient was given 5000 units of subcutaneous heparin for DVT prophylaxis. Postoperative prophylactic Lovenox dosing was started on the morning of postoperative day 1. The patient underwent an upper GI on postoperative day 1 which demonstrated no extravasation of contrast and emptying of the contrast into the small bowel. The patient was started on ice chips and water which they tolerated. On postoperative day 2 The patient's diet was advanced to protein shakes which she was having intermittent problems. She had nausea and some regurgitation and was not taking adequate PO for me to send her home. The patient was ambulating without difficulty. Their vital signs are stable without fever or tachycardia. Their hemoglobin had remained stable. The patient was maintained on their home  settings for CPAP therapy. The patient had received discharge instructions and counseling. On POD 3, she no longer had nausea and emesis. She was tolerating her shake and taking adequate PO. They were deemed stable for discharge.  BP 121/77  Pulse 73  Temp(Src) 98.2 F (36.8 C) (Oral)  Resp 18  Ht 5\' 4"  (1.626 m)  Wt 239 lb 6.4 oz (108.591 kg)  BMI 41.07 kg/m2  SpO2 90% BP 121/77  Pulse 73  Temp(Src) 98.2 F (36.8 C) (Oral)  Resp 18  Ht 5\' 4"  (1.626 m)  Wt 239 lb 6.4 oz (108.591 kg)  BMI 41.07 kg/m2  SpO2 90%  Gen: alert, NAD, non-toxic appearing Pupils: equal, no scleral icterus Pulm: Lungs clear to auscultation, symmetric chest rise CV: regular rate and rhythm Abd: soft, nontender, nondistended. healing trocar sites. No cellulitis. No incisional hernia Ext: no edema, no calf tenderness Skin: no rash, no jaundice  Discharge Instructions  Discharge Instructions   Discharge instructions    Complete by:  As directed   See bariatric discharge instructions     Increase activity slowly    Complete by:  As directed             Medication List    STOP taking these medications       oxyCODONE 5 MG/5ML solution  Commonly known as:  ROXICODONE      TAKE these medications       buPROPion 300 MG 24 hr tablet  Commonly known as:  WELLBUTRIN XL  Take 300 mg by mouth every morning.     HYDROcodone-acetaminophen 7.5-325 mg/15 ml solution  Commonly known as:  HYCET  Take 5-10 mLs by mouth every  4 (four) hours as needed for moderate pain.     ondansetron 4 MG tablet  Commonly known as:  ZOFRAN  Take 1 tablet (4 mg total) by mouth every 8 (eight) hours as needed for nausea or vomiting.     pantoprazole 40 MG tablet  Commonly known as:  PROTONIX  Take 1 tablet (40 mg total) by mouth daily.     TOPAMAX 100 MG tablet  Generic drug:  topiramate  Take 100 mg by mouth every morning.           Follow-up Information   Follow up with Atilano InaWILSON,Rodriques Badie M, MD On 06/20/2014.  (2:45 PM, For wound re-check)    Specialty:  General Surgery   Contact information:   [address] [place] [address] [phone]        The results of significant diagnostics from this hospitalization (including imaging, microbiology, ancillary and laboratory) are listed below for reference.    Significant Diagnostic Studies: Dg Ugi W/water Sol Cm  06/04/2014   CLINICAL DATA:  Postop gastric sleeve resection yesterday.  EXAM: WATER SOLUBLE UPPER GI SERIES  TECHNIQUE: Single-column upper GI series was performed using water soluble contrast.  CONTRAST:  50mL OMNIPAQUE IOHEXOL 300 MG/ML  SOLN  COMPARISON:  Preoperative upper GI series 02/19/2014.  FLUOROSCOPY TIME:  22 seconds.  FINDINGS: The scout abdominal radiograph demonstrates no abnormality. Patient swallowed the contrast without difficulty. The esophageal motility is normal. The gastric fundus filled normally. There are subsequent passage of contrast into the partitioned stomach and duodenum. No extravasation was identified. A small amount of reflux was noted during the examination.  IMPRESSION: No demonstrated complication following gastric sleeve resection.   Electronically Signed   By: Roxy HorsemanBill  Veazey M.D.   On: 06/04/2014 09:16    Labs: Basic Metabolic Panel:  Recent Labs Lab 06/04/14 0500  NA 139  K 4.3  CL 107  CO2 22  GLUCOSE 109*  BUN 7  CREATININE 0.65  CALCIUM 8.8   Liver Function Tests:  Recent Labs Lab 06/04/14 0500  AST 27  ALT 24  ALKPHOS 67  BILITOT 0.5  PROT 6.3  ALBUMIN 3.3*    CBC:  Recent Labs Lab 06/03/14 1244 06/04/14 0500 06/04/14 1600 06/05/14 0500  WBC  --  12.5*  --  9.3  NEUTROABS  --  10.2*  --  6.6  HGB 13.8 12.6 12.8 12.5  HCT 41.1 38.3 38.1 37.9  MCV  --  85.5  --  86.3  PLT  --  216  --  215    CBG: No results found for this basename: GLUCAP,  in the last 168 hours  Active Problems:   S/P laparoscopic sleeve gastrectomy   Time coordinating discharge:  15 minutes  Signed:  Atilano InaEric M Mia Milan, MD [facility], [place] [phone] 06/06/2014, 9:43 AM

## 2014-06-06 NOTE — Progress Notes (Signed)
Patient is alert and oriented, vital signs are stable, incisions are within normal, patient is tolerating her shakes without no nausea, pain medication adequate for pain relief Stanford BreedBracey, Deepak Bless N RN 06-06-2014 10:07am

## 2014-06-06 NOTE — Progress Notes (Signed)
Patient alert and oriented, Post op day 3.  Nausea with PO intake has resolved, patient feels ready for discharge. Provided support and encouragement.  Encouraged pulmonary toilet, ambulation and sips of liquids.  All questions answered.  Will continue to monitor.

## 2014-06-13 ENCOUNTER — Telehealth (HOSPITAL_COMMUNITY): Payer: Self-pay

## 2014-06-13 NOTE — Telephone Encounter (Signed)
Made discharge phone call to patient per DROP protocol. Asking the following questions.    1. Do you have someone to care for you now that you are home?  yes 2. Are you having pain now that is not relieved by your pain medication?  yes 3. Are you able to drink the recommended daily amount of fluids (48 ounces minimum/day) and protein (60-80 grams/day) as prescribed by the dietitian or nutritional counselor?  yes 4. Are you taking the vitamins and minerals as prescribed?  yes 5. Do you have the "on call" number to contact your surgeon if you have a problem or question?  yes 6. Are your incisions free of redness, swelling or drainage? (If steri strips, address that these can fall off, shower as tolerated) yes 7. Have your bowels moved since your surgery?  If not, are you passing gas?  yes 8. Are you up and walking 3-4 times per day?  yes    1. Do you have an appointment made to see your surgeon in the next month?  yes 2. Were you provided your discharge medications before your surgery or before you were discharged from the hospital and are you taking them without problem?  yes 3. Were you provided phone numbers to the clinic/surgeon's office?  yes 4. Did you watch the patient education video module in the (clinic, surgeon's office, etc.) before your surgery? yes 5. Do you have a discharge checklist that was provided to you in the hospital to reference with instructions on how to take care of yourself after surgery?  yes 6. Did you see a dietitian or nutritional counselor while you were in the hospital?  yes 7. Do you have an appointment to see a dietitian or nutritional counselor in the next month?  yes   

## 2014-06-18 ENCOUNTER — Encounter: Payer: BC Managed Care – PPO | Attending: General Surgery

## 2014-06-18 VITALS — Ht 63.5 in | Wt 221.5 lb

## 2014-06-18 DIAGNOSIS — Z01818 Encounter for other preprocedural examination: Secondary | ICD-10-CM | POA: Insufficient documentation

## 2014-06-18 DIAGNOSIS — Z713 Dietary counseling and surveillance: Secondary | ICD-10-CM | POA: Insufficient documentation

## 2014-06-18 NOTE — Progress Notes (Signed)
Bariatric Class:  Appt start time: 1530 end time:  1630.  2 Week Post-Operative Nutrition Class  Patient was seen on 06/18/2014 for Post-Operative Nutrition [facility]   Surgery date: 06/03/14 Surgery type: Gastric sleeve Start weight at [facility]: 239.5 lbs on 03/15/2014 Weight today: 221.5 lbs Weight Change: 23 lbs  TANITA  BODY COMP RESULTS  05/06/14 06/18/14   BMI (kg/m^2) 42.6 38.6   Fat Mass (lbs) 125.5 109.0   Fat Free Mass (lbs) 119 112.5   Total Body Water (lbs) 87 82.5    The following the learning objectives were met by the patient during this course:  Identifies Phase 3A (Soft, High Proteins) Dietary Goals and will begin from 2 weeks post-operatively to 2 months post-operatively  Identifies appropriate sources of fluids and proteins   States protein recommendations and appropriate sources post-operatively  Identifies the need for appropriate texture modifications, mastication, and bite sizes when consuming solids  Identifies appropriate multivitamin and calcium sources post-operatively  Describes the need for physical activity post-operatively and will follow MD recommendations  States when to call healthcare provider regarding medication questions or post-operative complications  Handouts given during class include:  Phase 3A: Soft, High Protein Diet Handout  Follow-Up Plan: Patient will follow-up at [facility] in 6 weeks for 2 month post-op nutrition visit for diet advancement per MD.

## 2014-06-18 NOTE — Patient Instructions (Signed)
Patient to follow Phase 3A-Soft, High Protein Diet and follow-up at [facility] in 6 weeks for 2 months post-op nutrition visit for diet advancement. 

## 2014-06-20 ENCOUNTER — Ambulatory Visit (INDEPENDENT_AMBULATORY_CARE_PROVIDER_SITE_OTHER): Payer: BC Managed Care – PPO | Admitting: General Surgery

## 2014-07-29 ENCOUNTER — Encounter: Payer: BC Managed Care – PPO | Attending: General Surgery | Admitting: Dietician

## 2014-07-29 VITALS — Ht 63.0 in | Wt 207.5 lb

## 2014-07-29 DIAGNOSIS — Z713 Dietary counseling and surveillance: Secondary | ICD-10-CM | POA: Insufficient documentation

## 2014-07-29 DIAGNOSIS — E669 Obesity, unspecified: Secondary | ICD-10-CM | POA: Diagnosis present

## 2014-07-29 NOTE — Progress Notes (Signed)
  Follow-up visit:  8 Weeks Post-Operative Gastric sleeve Surgery  Medical Nutrition Therapy:  Appt start time: 0830 end time:  0915.  Primary concerns today: Post-operative Bariatric Surgery Nutrition Management. Brittany Neal returns today having lost 14 pounds in the last 6 week. She has been setting alarms on her phone to remember to eat frequently enough. She was initially having significant pain with eating but this has resolved unless she eats too quickly. She reports that her skin is very itchy.   Surgery date: 06/03/14 Surgery type: Gastric sleeve [facility]5 lbs on 03/15/2014 Weight today: 207.5 lbs Weight Change: 14 lbs Total weight lost: 32 lbs  TANITA  BODY COMP RESULTS  05/06/14 06/18/14 07/29/14   BMI (kg/m^2) 42.6 38.6 36.2   Fat Mass (lbs) 125.5 109.0 97.5   Fat Free Mass (lbs) 119 112.5 110   Total Body Water (lbs) 87 82.5 80.5    Preferred Learning Style:   No preference indicated   Learning Readiness:   Ready  24-hr recall: B (AM): Unjury protein powder with unsweetened Carnation Instant breakfast (45g) Snk (10  AM): Babybel cheese (5g) L ( PM): [place]key pepperoni and cheese and maybe some green beans (6-12g) Snk (2 PM): Laughing cow cheese  D (PM): 3 oz meat OR canadian bacon with egg (12-21g) Snk ( PM):   Fluid intake: 32-48 oz water + 48 oz protein shake Estimated total protein intake: 60+ grams per day  Medications: see list Supplementation: having trouble with multivitamin   Using straws: yes, no gas pain reported Drinking while eating: no Hair loss: none Carbonated beverages: none N/V/D/C: some constipation Dumping syndrome: none  Recent physical activity:  Walking, plans to begin weight training  Progress Towards Goal(s):  In progress.  Handouts given during visit include:  Phase 3B lean protein + non-starchy vegetables   Nutritional Diagnosis:  [place]-3.3 Overweight/obesity related to past poor dietary habits and physical inactivity  as evidenced by patient w/ recent gastric sleeve surgery following dietary guidelines for continued weight loss.     Intervention:  Nutrition counseling provided.  Teaching Method Utilized:  Visual Auditory  Barriers to learning/adherence to lifestyle change: none  Demonstrated degree of understanding via:  Teach Back   Monitoring/Evaluation:  Dietary intake, exercise, and body weight. Follow up in 6 weeks for 3.5 month post-op visit.

## 2014-07-29 NOTE — Patient Instructions (Addendum)
Goals:  Follow Phase 3B: High Protein + Non-Starchy Vegetables  Eat 3-6 small meals/snacks, every 3-5 hrs  Increase lean protein foods to meet 60g goal  Increase fluid intake to 64oz +  Avoid drinking 15 minutes before, during and 30 minutes after eating  Aim for >30 min of physical activity daily  Try unsweetened cocoa powder   TANITA  BODY COMP RESULTS  05/06/14 06/18/14 07/29/14   BMI (kg/m^2) 42.6 38.6 36.2   Fat Mass (lbs) 125.5 109.0 97.5   Fat Free Mass (lbs) 119 112.5 110   Total Body Water (lbs) 87 82.5 80.5

## 2014-07-30 ENCOUNTER — Ambulatory Visit: Payer: BC Managed Care – PPO | Admitting: Dietician

## 2014-08-19 ENCOUNTER — Encounter (HOSPITAL_COMMUNITY): Payer: Self-pay | Admitting: General Surgery

## 2014-09-09 ENCOUNTER — Encounter: Payer: BC Managed Care – PPO | Attending: General Surgery | Admitting: Dietician

## 2014-09-09 VITALS — Ht 63.0 in | Wt 195.5 lb

## 2014-09-09 DIAGNOSIS — E669 Obesity, unspecified: Secondary | ICD-10-CM | POA: Insufficient documentation

## 2014-09-09 DIAGNOSIS — Z713 Dietary counseling and surveillance: Secondary | ICD-10-CM | POA: Diagnosis not present

## 2014-09-09 NOTE — Patient Instructions (Addendum)
Goals:  Follow Phase 3B: High Protein + Non-Starchy Vegetables  Eat 3-6 small meals/snacks, every 3-5 hrs  Increase lean protein foods to meet 60g goal  Increase fluid intake to 64oz +  Avoid drinking 15 minutes before, during and 30 minutes after eating  Aim for >30 min of physical activity daily  Watch portions of fruit and nuts  Try Citracal Petites  Go to support group  Fair Life milk   TANITA  BODY COMP RESULTS  05/06/14 06/18/14 07/29/14 09/09/14   BMI (kg/m^2) 42.6 38.6 36.2 34.6   Fat Mass (lbs) 125.5 109.0 97.5 90.5   Fat Free Mass (lbs) 119 112.5 110 105   Total Body Water (lbs) 87 82.5 80.5 77

## 2014-09-09 NOTE — Progress Notes (Signed)
  Follow-up visit:  3 months Post-Operative Gastric sleeve Surgery  Medical Nutrition Therapy:  Appt start time: 1115 end time:  1145  Primary concerns today: Post-operative Bariatric Surgery Nutrition Management. Brittany Neal returns today having lost another 12 pounds. She has not started exercising but plans to start. She reports that her skin is still very itchy. Her pain when eating has resolved. She states she is tempted to skip meals but she makes sure to eat frequently throughout the day.  Surgery date: 06/03/14 Surgery type: Gastric sleeve [facility]5 lbs on 03/15/2014 Weight today: 195.5 lbs Weight Change: 12 lbs Total weight lost: 44 lbs  TANITA  BODY COMP RESULTS  05/06/14 06/18/14 07/29/14 09/09/14   BMI (kg/m^2) 42.6 38.6 36.2 34.6   Fat Mass (lbs) 125.5 109.0 97.5 90.5   Fat Free Mass (lbs) 119 112.5 110 105   Total Body Water (lbs) 87 82.5 80.5 77    Preferred Learning Style:   No preference indicated   Learning Readiness:   Ready  24-hr recall: B (AM): unflavored protein powder with 1oz orange juice (30g) Snk (10  AM): Babybel cheese (5g) L ( PM): Atkins frozen meal or eggs (12-15g) Snk (2 PM): carrots and hummus, cheese stick (7g) D (5-7PM): 3 oz meat OR canadian bacon with egg (12-21g) Snk ( PM): cheese or kale chips or 4 lentil chips with guacamole  Fluid intake: 48-64 oz; very dry skin Estimated total protein intake: 60+ grams per day  Medications: see list Supplementation: taking multivitamin but not calcium   Using straws: yes, no gas pain reported Drinking while eating: no Hair loss: none Carbonated beverages: none N/V/D/C: some constipation Dumping syndrome: none  Recent physical activity:  Walking  Progress Towards Goal(s):  In progress.    Nutritional Diagnosis:  [name]-3.3 Overweight/obesity related to past poor dietary habits and physical inactivity as evidenced by patient w/ recent gastric sleeve surgery following dietary guidelines  for continued weight loss.     Intervention:  Nutrition counseling provided.  Teaching Method Utilized:  Visual Auditory  Barriers to learning/adherence to lifestyle change: none  Demonstrated degree of understanding via:  Teach Back   Monitoring/Evaluation:  Dietary intake, exercise, and body weight. Follow up in 6 weeks for 5 month post-op visit.

## 2014-09-30 ENCOUNTER — Encounter (INDEPENDENT_AMBULATORY_CARE_PROVIDER_SITE_OTHER): Payer: Self-pay | Admitting: General Surgery

## 2014-10-03 ENCOUNTER — Other Ambulatory Visit (INDEPENDENT_AMBULATORY_CARE_PROVIDER_SITE_OTHER): Payer: Self-pay

## 2014-10-03 DIAGNOSIS — G43809 Other migraine, not intractable, without status migrainosus: Secondary | ICD-10-CM

## 2014-10-03 DIAGNOSIS — F329 Major depressive disorder, single episode, unspecified: Secondary | ICD-10-CM

## 2014-10-03 DIAGNOSIS — Z9884 Bariatric surgery status: Secondary | ICD-10-CM

## 2014-10-03 DIAGNOSIS — F32A Depression, unspecified: Secondary | ICD-10-CM

## 2014-10-03 DIAGNOSIS — G4733 Obstructive sleep apnea (adult) (pediatric): Secondary | ICD-10-CM

## 2014-10-09 ENCOUNTER — Ambulatory Visit
Admission: RE | Admit: 2014-10-09 | Discharge: 2014-10-09 | Disposition: A | Discharge status: Home / Self Care | Payer: BC Managed Care – PPO | Source: Ambulatory Visit | Attending: General Surgery | Admitting: General Surgery

## 2014-10-09 ENCOUNTER — Other Ambulatory Visit (INDEPENDENT_AMBULATORY_CARE_PROVIDER_SITE_OTHER): Payer: Self-pay

## 2014-10-09 DIAGNOSIS — M542 Cervicalgia: Secondary | ICD-10-CM

## 2014-10-09 DIAGNOSIS — R200 Anesthesia of skin: Secondary | ICD-10-CM

## 2014-10-14 ENCOUNTER — Telehealth (INDEPENDENT_AMBULATORY_CARE_PROVIDER_SITE_OTHER): Payer: Self-pay

## 2014-10-14 NOTE — Telephone Encounter (Signed)
Pt would like her results from her CT scans, do you want to me to release these results? Please advise. Thank you

## 2014-10-14 NOTE — Telephone Encounter (Signed)
Yes. Please. i tried just a few minutes ago and no one picked up her cellphone. i did reach her husband and told her husband. We will need to refer her to [facility] for an opinion and neurosurgeon of her choosing for left hand 4/5th finger numbness

## 2014-10-15 ENCOUNTER — Encounter (HOSPITAL_COMMUNITY)
Admission: RE | Disposition: A | Discharge status: Home / Self Care | Payer: Self-pay | Source: Ambulatory Visit | Attending: General Surgery

## 2014-10-15 ENCOUNTER — Encounter (HOSPITAL_COMMUNITY): Payer: Self-pay | Admitting: Gastroenterology

## 2014-10-15 ENCOUNTER — Ambulatory Visit (HOSPITAL_COMMUNITY)
Admission: RE | Admit: 2014-10-15 | Discharge: 2014-10-15 | Disposition: A | Discharge status: Home / Self Care | Payer: BC Managed Care – PPO | Source: Ambulatory Visit | Attending: General Surgery | Admitting: General Surgery

## 2014-10-15 DIAGNOSIS — G43909 Migraine, unspecified, not intractable, without status migrainosus: Secondary | ICD-10-CM | POA: Diagnosis not present

## 2014-10-15 DIAGNOSIS — Z888 Allergy status to other drugs, medicaments and biological substances status: Secondary | ICD-10-CM | POA: Diagnosis not present

## 2014-10-15 DIAGNOSIS — K5790 Diverticulosis of intestine, part unspecified, without perforation or abscess without bleeding: Secondary | ICD-10-CM | POA: Diagnosis not present

## 2014-10-15 DIAGNOSIS — F419 Anxiety disorder, unspecified: Secondary | ICD-10-CM | POA: Diagnosis not present

## 2014-10-15 DIAGNOSIS — E559 Vitamin D deficiency, unspecified: Secondary | ICD-10-CM | POA: Diagnosis not present

## 2014-10-15 DIAGNOSIS — Z800 Family history of malignant neoplasm of digestive organs: Secondary | ICD-10-CM | POA: Diagnosis not present

## 2014-10-15 DIAGNOSIS — Z1211 Encounter for screening for malignant neoplasm of colon: Secondary | ICD-10-CM | POA: Insufficient documentation

## 2014-10-15 DIAGNOSIS — F329 Major depressive disorder, single episode, unspecified: Secondary | ICD-10-CM | POA: Diagnosis not present

## 2014-10-15 HISTORY — PX: COLONOSCOPY: SHX5424

## 2014-10-15 LAB — HM COLONOSCOPY

## 2014-10-15 SURGERY — COLONOSCOPY
Anesthesia: Moderate Sedation

## 2014-10-15 MED ORDER — MIDAZOLAM HCL 10 MG/2ML IJ SOLN
INTRAMUSCULAR | Status: DC | PRN
  Administered 2014-10-15: 1 mg via INTRAVENOUS
  Administered 2014-10-15 (×2): 2 mg via INTRAVENOUS

## 2014-10-15 MED ORDER — FENTANYL CITRATE 0.05 MG/ML IJ SOLN
INTRAMUSCULAR | Status: AC
  Filled 2014-10-15: qty 2

## 2014-10-15 MED ORDER — DIPHENHYDRAMINE HCL 50 MG/ML IJ SOLN
INTRAMUSCULAR | Status: AC
  Filled 2014-10-15: qty 1

## 2014-10-15 MED ORDER — FENTANYL CITRATE 0.05 MG/ML IJ SOLN
INTRAMUSCULAR | Status: DC | PRN
  Administered 2014-10-15 (×4): 25 ug via INTRAVENOUS

## 2014-10-15 MED ORDER — MIDAZOLAM HCL 10 MG/2ML IJ SOLN
INTRAMUSCULAR | Status: AC
  Filled 2014-10-15: qty 2

## 2014-10-15 MED ORDER — SODIUM CHLORIDE 0.9 % IV SOLN
INTRAVENOUS | Status: DC
  Administered 2014-10-15: 500 mL via INTRAVENOUS

## 2014-10-15 NOTE — Discharge Instructions (Signed)
Post Colonoscopy Instructions ° °1. DIET: Follow a light bland diet the first 24 hours after arrival home, such as soup, liquids, crackers, etc.  Be sure to include lots of fluids daily.  Avoid fast food or heavy meals as your are more likely to get nauseated.   °2. You may have some mild rectal bleeding for the first few days after the procedure.  This should get less and less with time.  Resume any blood thinners 2 days after your procedure unless directed otherwise by your physician. °3. Take your usually prescribed home medications unless otherwise directed. °a. If you have any pain, it is helpful to get up and walk around, as it is usually from excess gas. °b. If this is not helpful, you can take an over-the-counter pain medication.  Choose one of the following that works best for you: °i. Naproxen (Aleve, etc)  Two 220mg tabs twice a day °ii. Ibuprofen (Advil, etc) Three 200mg tabs four times a day (every meal & bedtime) °iii. If you still have pain after using one of these, please call the office °4. It is normal to not have a bowel movement for 2-3 days after colonoscopy.   ° °5. ACTIVITIES as tolerated:   °6. You may resume regular (light) daily activities beginning the next day--such as daily self-care, walking, climbing stairs--gradually increasing activities as tolerated.  ° ° °WHEN TO CALL US [phone]: °1. Fever over 101.5 F (38.5 C)  °2. Severe abdominal or chest pain  °3. Large amount of rectal bleeding, passing multiple blood clots  °4. Dizziness or shortness of breath °5. Increasing nausea or vomiting ° ° The clinic staff is available to answer your questions during regular business hours (8:30am-5pm).  Please don’t hesitate to call and ask to speak to one of our nurses for clinical concerns.  ° If you have a medical emergency, go to the nearest emergency room or call 911. ° A surgeon from [facility] is always on call at the hospitals ° ° °[facility] °[address], [address], [place], [place]  [address] ? °MAIN: [phone] ? TOLL FREE: [phone] ?  °FAX [phone] °[email or web address] ° ° °

## 2014-10-15 NOTE — H&P (Signed)
Brittany Neal is an 53 y.o. female.   Chief Complaint: colorectal cancer screening HPI: pt is s/p gastric sleeve resection for weight loss.  She is here today for a screening colonoscopy.  She has had some irregular bowel habits after her surgery but denies abd pain.  She has no family history of colon cancer.    Past Medical History  Diagnosis Date  . CIN II (cervical intraepithelial neoplasia II)   . Depression   . ADHD (attention deficit hyperactivity disorder)   . PONV (postoperative nausea and vomiting)   . Vitamin D deficiency   . Headache(784.0)     migraines  . Anxiety     stress related    Past Surgical History  Procedure Laterality Date  . Knee surgery Left 2000  . Dilation and curettage of uterus  2007  . Abdominal hysterectomy  2007  . Laparoscopic gastric sleeve resection N/A 06/03/2014    Procedure: LAPAROSCOPIC GASTRIC SLEEVE RESECTION WITH HIATAL HERNIA  REPAIR;  Surgeon: Atilano InaEric M Wilson, MD;  Location: [facility];  Service: General;  Laterality: N/A;    Family History  Problem Relation Age of Onset  . Heart disease Sister     MV  . Colon cancer Neg Hx   . Stroke Other   . Obesity Other    Social History:  reports that she has never smoked. She has never used smokeless tobacco. She reports that she drinks about 0.6 oz of alcohol per week. She reports that she does not use illicit drugs.  Allergies:  Allergies  Allergen Reactions  . Percocet [Oxycodone-Acetaminophen] Itching    Medications Prior to Admission  Medication Sig Dispense Refill  . buPROPion (WELLBUTRIN XL) 300 MG 24 hr tablet Take 300 mg by mouth every morning.     . topiramate (TOPAMAX) 100 MG tablet Take 100 mg by mouth every morning.     Marland Kitchen. HYDROcodone-acetaminophen (HYCET) 7.5-325 mg/15 ml solution Take 5-10 mLs by mouth every 4 (four) hours as needed for moderate pain. 120 mL 0  . ondansetron (ZOFRAN) 4 MG tablet Take 1 tablet (4 mg total) by mouth every 8 (eight) hours as needed for nausea or  vomiting. 30 tablet 0  . pantoprazole (PROTONIX) 40 MG tablet Take 1 tablet (40 mg total) by mouth daily. 30 tablet 0    No results found for this or any previous visit (from the past 48 hour(s)). No results found.  Review of Systems  Constitutional: Negative for fever and chills.  HENT: Negative for hearing loss.   Eyes: Negative for blurred vision and double vision.  Respiratory: Negative for cough and shortness of breath.   Cardiovascular: Negative for chest pain and palpitations.  Gastrointestinal: Negative for nausea, vomiting and abdominal pain.  Genitourinary: Negative for dysuria, urgency and frequency.  Musculoskeletal: Negative for myalgias and joint pain.  Skin: Negative for rash.  Neurological: Negative for dizziness, tingling and headaches.    Blood pressure 128/83, pulse 85, temperature 97.8 F (36.6 C), temperature source Oral, resp. rate 19, height 5\' 3"  (1.6 m), weight 188 lb (85.276 kg), SpO2 99 %. Physical Exam  Constitutional: She is oriented to person, place, and time. She appears well-developed and well-nourished. No distress.  HENT:  Head: Normocephalic and atraumatic.  Eyes: Conjunctivae are normal. Pupils are equal, round, and reactive to light.  Neck: Normal range of motion. No tracheal deviation present. No thyromegaly present.  Cardiovascular: Normal rate and regular rhythm.   Respiratory: Effort normal and breath sounds normal.  No respiratory distress. She has no wheezes.  GI: Soft. Bowel sounds are normal. She exhibits no distension. There is no tenderness. There is no rebound and no guarding.  Musculoskeletal: Normal range of motion.  Neurological: She is alert and oriented to person, place, and time.  Skin: Skin is warm and dry. She is not diaphoretic.     Assessment/Plan 53 y.o. F with normal risk for moderate sedation.  Will plan on performing screening colonoscopy.  Risks of procedure include pain, bleeding, missed polyp and perforation of  colon.  These risks are small.  She has completed her bowel prep and has agreed to proceed with the colonoscopy.    Brittany Neal C. 10/15/2014, 1:59 PM

## 2014-10-16 ENCOUNTER — Encounter (HOSPITAL_COMMUNITY): Payer: Self-pay | Admitting: General Surgery

## 2014-10-17 ENCOUNTER — Encounter: Payer: Self-pay | Admitting: Sports Medicine

## 2014-10-17 ENCOUNTER — Ambulatory Visit (INDEPENDENT_AMBULATORY_CARE_PROVIDER_SITE_OTHER): Payer: BC Managed Care – PPO | Admitting: Sports Medicine

## 2014-10-17 VITALS — BP 122/72 | HR 89 | Ht 63.0 in | Wt 187.0 lb

## 2014-10-17 DIAGNOSIS — M25512 Pain in left shoulder: Secondary | ICD-10-CM

## 2014-10-17 DIAGNOSIS — M5412 Radiculopathy, cervical region: Secondary | ICD-10-CM

## 2014-10-17 MED ORDER — MELOXICAM 15 MG PO TABS: ORAL_TABLET | ORAL | Status: DC

## 2014-10-17 NOTE — Progress Notes (Signed)
   Subjective:    I'm seeing this patient as a consultation for:  Dr. Sigmund HazelLisa Neal  CC: left shoulder pain  HPI: For the past several weeks this pleasant 53 year old female has had increasing pain that she localizes over the deltoid worse with overhead activities, no trauma. Pain is moderate, persistent, she also gets neck pain that radiates down into the second and third fingers of her left hand. Shoulder pain began first. Her general surgeon did order neck and shoulder x-rays results of which will be dictated below.  Past medical history, Surgical history, Family history not pertinant except as noted below, Social history, Allergies, and medications have been entered into the medical record, reviewed, and no changes needed.   Review of Systems: No headache, visual changes, nausea, vomiting, diarrhea, constipation, dizziness, abdominal pain, skin rash, fevers, chills, night sweats, weight loss, swollen lymph nodes, body aches, joint swelling, muscle aches, chest pain, shortness of breath, mood changes, visual or auditory hallucinations.   Objective:   General: Well Developed, well nourished, and in no acute distress.  Neuro/Psych: Alert and oriented x3, extra-ocular muscles intact, able to move all 4 extremities, sensation grossly intact. Skin: Warm and dry, no rashes noted.  Respiratory: Not using accessory muscles, speaking in full sentences, trachea midline.  Cardiovascular: Pulses palpable, no extremity edema. Abdomen: Does not appear distended. Left Shoulder: Inspection reveals no abnormalities, atrophy or asymmetry. Palpation is normal with no tenderness over AC joint or bicipital groove. ROM is full in all planes. Rotator cuff strength normal throughout. positive Neer and Hawkin's tests, empty can. Speeds and Yergason's tests mildly positive. positive Obrien's, positive crank, negative clunk, and good stability. Normal scapular function observed. No painful arc and no drop arm  sign. No apprehension sign  Procedure: Real-time Ultrasound Guided Injection of left subacromial bursa Device: GE Logiq E  Verbal informed consent obtained.  Time-out conducted.  Noted no overlying erythema, induration, or other signs of local infection.  Skin prepped in a sterile fashion.  Local anesthesia: Topical Ethyl chloride.  With sterile technique and under real time ultrasound guidance:  1 mL kenalog 40, 4 mL lidocaine injected easily. Completed without difficulty  Pain immediately resolved suggesting accurate placement of the medication.  Advised to call if fevers/chills, erythema, induration, drainage, or persistent bleeding.  Images permanently stored and available for review in the ultrasound unit.  Impression: Technically successful ultrasound guided injection.  Shoulder x-ray show mild acromioclavicular arthritis, neck x-ray show multilevel cervical degenerative disc disease.  Impression and Recommendations:   This case required medical decision making of moderate complexity.

## 2014-10-17 NOTE — Assessment & Plan Note (Signed)
Symptoms resemble subacromial bursitis. Injection, formal physical therapy. Return in one month, MRI if no better.

## 2014-10-17 NOTE — Assessment & Plan Note (Signed)
There are some left-sided C7 radicular symptoms.  We will first focus on the shoulder before proceeding to any form of intervention in the cervical spine.

## 2014-10-21 ENCOUNTER — Ambulatory Visit: Payer: BC Managed Care – PPO | Admitting: Dietician

## 2014-10-22 ENCOUNTER — Encounter (HOSPITAL_COMMUNITY): Payer: Self-pay | Admitting: General Surgery

## 2014-10-22 ENCOUNTER — Telehealth: Payer: Self-pay

## 2014-10-22 NOTE — Telephone Encounter (Signed)
Patient was seen on 10/17/2014 for shoulder pain she was given medication and she has also scheduled appt with PT patient stated that the medication is not working and she is sore. Please advise patient on what to do. Rhonda Cunningham,CMA

## 2014-10-22 NOTE — Telephone Encounter (Signed)
Did she have any response, even temporary to the injection?

## 2014-10-23 MED ORDER — TRAMADOL HCL 50 MG PO TABS: ORAL_TABLET | ORAL | Status: DC

## 2014-10-23 MED ORDER — PREDNISONE 50 MG PO TABS: ORAL_TABLET | ORAL | Status: DC

## 2014-10-23 NOTE — Telephone Encounter (Signed)
I am going to add 5 days of prednisone and tramadol for pain. Prescription will be in my box.

## 2014-10-23 NOTE — Telephone Encounter (Signed)
Spoke to patient advised her of medications that were faxed to [facility]. Brittany Neal,CMA

## 2014-10-23 NOTE — Telephone Encounter (Signed)
Spoke to patient she stated  That did not have any relief after the injection. Rhonda Cunningham,CMA

## 2014-10-24 ENCOUNTER — Encounter: Payer: PRIVATE HEALTH INSURANCE | Attending: General Surgery | Admitting: Dietician

## 2014-10-24 VITALS — Ht 63.0 in | Wt 187.5 lb

## 2014-10-24 DIAGNOSIS — E669 Obesity, unspecified: Secondary | ICD-10-CM | POA: Diagnosis present

## 2014-10-24 DIAGNOSIS — Z713 Dietary counseling and surveillance: Secondary | ICD-10-CM | POA: Diagnosis not present

## 2014-10-24 NOTE — Patient Instructions (Addendum)
Goals:  Follow Phase 3B: High Protein + Non-Starchy Vegetables  Eat 3-6 small meals/snacks, every 3-5 hrs  Increase lean protein foods to meet 60g goal  Increase fluid intake to 64oz +  Avoid drinking 15 minutes before, during and 30 minutes after eating  Aim for >30 min of physical activity daily  Aim for 15 grams of carbs   TANITA  BODY COMP RESULTS  05/06/14 06/18/14 07/29/14 09/09/14 10/24/14   BMI (kg/m^2) 42.6 38.6 36.2 34.6 33.2   Fat Mass (lbs) 125.5 109.0 97.5 90.5 79.5   Fat Free Mass (lbs) 119 112.5 110 105 108   Total Body Water (lbs) 87 82.5 80.5 77 79

## 2014-10-24 NOTE — Progress Notes (Signed)
  Follow-up visit:  5 months Post-Operative Gastric sleeve Surgery  Medical Nutrition Therapy:  Appt start time: 310 end time:  345  Primary concerns today: Post-operative Bariatric Surgery Nutrition Management. Brittany Neal returns today having lost another 8 pounds (11 pounds of fat). She reports that she struggled with tempting foods throughout the holidays. She gave away the sweets she was given. Tried a chocolate truffle and it helped her cravings. Snacking on pistachios and tried some fruit and pasta. Using unsweetened cocoa powder and cinnamon in protein shakes.   Surgery date: 06/03/14 Surgery type: Gastric sleeve [facility] lbs on 03/15/2014 Weight today: 187.5 lbs Weight Change: 8 lbs Total weight lost: 52 lbs  Goal weight: 150-160 lbs  TANITA  BODY COMP RESULTS  05/06/14 06/18/14 07/29/14 09/09/14 10/24/14   BMI (kg/m^2) 42.6 38.6 36.2 34.6 33.2   Fat Mass (lbs) 125.5 109.0 97.5 90.5 79.5   Fat Free Mass (lbs) 119 112.5 110 105 108   Total Body Water (lbs) 87 82.5 80.5 77 79    Preferred Learning Style:   No preference indicated   Learning Readiness:   Ready  24-hr recall: B (AM): unflavored protein powder with 1oz orange juice (30g) Snk (10 AM): Babybel cheese (5g) L ( PM): Atkins frozen meal or eggs (12-15g) Snk (2 PM): carrots and hummus, cheese stick (7g) D (5-7PM): 3 oz meat OR canadian bacon with egg (12-21g) Snk ( PM): cheese or kale chips or 4 lentil chips with guacamole  Fluid intake: needs to work on increasing fluid intake at work Estimated total protein intake: 60+ grams per day  Medications: see list, currently taking prednisone Supplementation: taking Calcium; taking 1/2 multivitamin requirement   Using straws: yes, no gas pain reported Drinking while eating: no Hair loss: none Carbonated beverages: tried Dr. Reino KentPepper and had acid reflux all night N/V/D/C: still some constipation Dumping syndrome: none  Recent physical activity:   Walking  Progress Towards Goal(s):  In progress.    Nutritional Diagnosis:  Felton-3.3 Overweight/obesity related to past poor dietary habits and physical inactivity as evidenced by patient w/ recent gastric sleeve surgery following dietary guidelines for continued weight loss.   Intervention:  Nutrition counseling provided.  Goals:  Follow Phase 3B: High Protein + Non-Starchy Vegetables  Eat 3-6 small meals/snacks, every 3-5 hrs  Increase lean protein foods to meet 60g goal  Increase fluid intake to 64oz +  Avoid drinking 15 minutes before, during and 30 minutes after eating  Aim for >30 min of physical activity daily  Aim for 15 grams of carbs  Teaching Method Utilized:  Visual Auditory  Barriers to learning/adherence to lifestyle change: none  Demonstrated degree of understanding via:  Teach Back   Monitoring/Evaluation:  Dietary intake, exercise, and body weight. Follow up in 6 weeks for 7 month post-op visit.

## 2014-10-28 ENCOUNTER — Ambulatory Visit: Payer: PRIVATE HEALTH INSURANCE | Admitting: Physical Therapy

## 2014-11-18 ENCOUNTER — Ambulatory Visit: Payer: BC Managed Care – PPO | Admitting: Sports Medicine

## 2014-12-03 ENCOUNTER — Ambulatory Visit: Payer: PRIVATE HEALTH INSURANCE | Admitting: Dietician

## 2014-12-17 ENCOUNTER — Ambulatory Visit: Payer: PRIVATE HEALTH INSURANCE | Admitting: Sports Medicine

## 2014-12-27 ENCOUNTER — Encounter: Payer: Self-pay | Admitting: Sports Medicine

## 2014-12-27 ENCOUNTER — Ambulatory Visit (INDEPENDENT_AMBULATORY_CARE_PROVIDER_SITE_OTHER): Payer: PRIVATE HEALTH INSURANCE | Admitting: Sports Medicine

## 2014-12-27 VITALS — BP 95/61 | HR 83 | Ht 63.0 in | Wt 176.0 lb

## 2014-12-27 DIAGNOSIS — Z23 Encounter for immunization: Secondary | ICD-10-CM | POA: Diagnosis not present

## 2014-12-27 DIAGNOSIS — Z0000 Encounter for general adult medical examination without abnormal findings: Secondary | ICD-10-CM | POA: Insufficient documentation

## 2014-12-27 DIAGNOSIS — Z9884 Bariatric surgery status: Secondary | ICD-10-CM

## 2014-12-27 DIAGNOSIS — M5412 Radiculopathy, cervical region: Secondary | ICD-10-CM | POA: Diagnosis not present

## 2014-12-27 DIAGNOSIS — E538 Deficiency of other specified B group vitamins: Secondary | ICD-10-CM | POA: Diagnosis not present

## 2014-12-27 MED ORDER — CYANOCOBALAMIN 1000 MCG/ML IJ SOLN
1000.0000 ug | Freq: Once | INTRAMUSCULAR | Status: AC
  Administered 2014-12-27: 1000 ug via INTRAMUSCULAR

## 2014-12-27 NOTE — Assessment & Plan Note (Signed)
Colonoscopy was last month, mammogram last year in August, hysterectomy for dysplasia so Pap smears are through OB/GYN. Checking routine blood work. Tetanus shot today.

## 2014-12-27 NOTE — Assessment & Plan Note (Signed)
We will do monthly vitamin B12 injections, 1000 mg due to post gastrectomy.

## 2014-12-27 NOTE — Assessment & Plan Note (Signed)
With moderate left C7 radiculopathy, and C5-C7 degenerative disc disease. Formal physical therapy, return in a month, we will get more aggressive if no better.

## 2014-12-27 NOTE — Progress Notes (Signed)
  Subjective:    CC: Establish care  HPI: This is a very pleasant 54 year old female, she is post-gastric sleeve, due to this she will need vitamin B12 shots monthly.  Neck pain: Improved significantly, she does have some left-sided C7 symptoms, she is amenable to try formal physical therapy.  Preventive measures: History of cervical dysplasia and post hysterectomy, she still gets Pap smears for this reason, up-to-date on colonoscopy, and mammogram is coming up.  Past medical history, Surgical history, Family history not pertinant except as noted below, Social history, Allergies, and medications have been entered into the medical record, reviewed, and no changes needed.   Review of Systems: No fevers, chills, night sweats, weight loss, chest pain, or shortness of breath.   Objective:    General: Well Developed, well nourished, and in no acute distress.  Neuro: Alert and oriented x3, extra-ocular muscles intact, sensation grossly intact.  HEENT: Normocephalic, atraumatic, pupils equal round reactive to light, neck supple, no masses, no lymphadenopathy, thyroid nonpalpable.  Skin: Warm and dry, no rashes. Cardiac: Regular rate and rhythm, no murmurs rubs or gallops, no lower extremity edema.  Respiratory: Clear to auscultation bilaterally. Not using accessory muscles, speaking in full sentences.  Impression and Recommendations:

## 2015-01-09 ENCOUNTER — Encounter: Payer: Self-pay | Admitting: Physical Therapy

## 2015-01-09 ENCOUNTER — Ambulatory Visit (INDEPENDENT_AMBULATORY_CARE_PROVIDER_SITE_OTHER): Payer: PRIVATE HEALTH INSURANCE | Admitting: Physical Therapy

## 2015-01-09 DIAGNOSIS — M25612 Stiffness of left shoulder, not elsewhere classified: Secondary | ICD-10-CM

## 2015-01-09 DIAGNOSIS — M5010 Cervical disc disorder with radiculopathy, unspecified cervical region: Secondary | ICD-10-CM | POA: Diagnosis not present

## 2015-01-09 DIAGNOSIS — R531 Weakness: Secondary | ICD-10-CM

## 2015-01-09 DIAGNOSIS — M25512 Pain in left shoulder: Secondary | ICD-10-CM

## 2015-01-09 DIAGNOSIS — M542 Cervicalgia: Secondary | ICD-10-CM

## 2015-01-09 NOTE — Patient Instructions (Addendum)
  Do in pain free range of motion.  Supine cane External rotation   Hold cane with both hands. Rotate arm away from body. Keep elbow on floor and next to body. ___ reps per set, ___ sets per day, ___ days per week Add towel to keep elbow at side.  Copyright  VHI. All rights reserved.  Cane Horizontal - Supine   With straight arms holding cane above shoulders, bring cane out to right, center, out to left, and back to above head. Repeat __10_ times. Do _2__ times per day.  Copyright  VHI. All rights reserved.  Cane Exercise: Flexion   Lie on back, holding cane above chest. Keeping arms as straight as possible, lower cane toward floor beyond head. Hold __2__ seconds. Repeat __10_ times. Do __2__ sessions per day.  [email or web address]   Copyright  VHI. All rights reserved.   Flexibility: Upper Trapezius Stretch   Gently grasp right side of head while reaching behind back with other hand. Tilt head away until a gentle stretch is felt. Hold _30___ seconds. Repeat 3__ times per set. Do ____ sets per session. Do _2___ sessions per day.  [email or web address].[email or web address]   Levator Stretch   Grasp seat or sit on hand on side to be stretched. Turn head toward other side and look down. Use hand on head to gently stretch neck in that position. Hold 30____ seconds. Repeat on other side. Repeat __3__ times. Do _2___ sessions per day.  [email or web address]   Scapular Retraction (Standing)   With arms at sides, pinch shoulder blades together. Repeat __10_ times per set. Do ____ sets per session. Do _3-4___ sessions per day.  [email or web address]://[email or web address]   Flexibility: Neck Retraction   Pull head straight back, keeping eyes and jaw level. Repeat _10__ times per set. Do ____ sets per session. Do _2__ sessions per day.  [email or web address]   Posture - Sitting   Sit upright, head facing forward. Try using a roll to support lower back. Keep shoulders relaxed, and avoid rounded back.  Keep hips level with knees. Avoid crossing legs for long periods.

## 2015-01-09 NOTE — Therapy (Deleted)
[facility] [place] [address] [place], [place], [address] Phone: [phone]   Fax:  [phone]  Physical Therapy Evaluation  Patient Details  Name: Brittany Neal MRN: [id number] Date of Birth: 04-28-1961 Referring Provider:  Monica Becton,*  Encounter Date: 01/09/2015      PT End of Session - 01/09/15 1624    Visit Number 1   Number of Visits 12   Date for PT Re-Evaluation 02/20/15   PT Start Time 1534   PT Stop Time 1638   PT Time Calculation (min) 64 min   Activity Tolerance Patient tolerated treatment well   Behavior During Therapy [facility] for tasks assessed/performed      Past Medical History  Diagnosis Date  . CIN II (cervical intraepithelial neoplasia II)   . Depression   . ADHD (attention deficit hyperactivity disorder)   . PONV (postoperative nausea and vomiting)   . Vitamin D deficiency   . Headache(784.0)     migraines  . Anxiety     stress related    Past Surgical History  Procedure Laterality Date  . Knee surgery Left 2000  . Dilation and curettage of uterus  2007  . Abdominal hysterectomy  2007  . Laparoscopic gastric sleeve resection N/A 06/03/2014    Procedure: LAPAROSCOPIC GASTRIC SLEEVE RESECTION WITH HIATAL HERNIA  REPAIR;  Surgeon: Atilano Ina, MD;  Location: [facility];  Service: General;  Laterality: N/A;  . Colonoscopy N/A 10/15/2014    Procedure: COLONOSCOPY;  Surgeon: Romie Levee, MD;  Location: [facility];  Service: Endoscopy;  Laterality: N/A;    There were no vitals filed for this visit.  Visit Diagnosis:  Cervical disc disorder with radiculopathy of cervical region - Plan: PT plan of care cert/re-cert  Neck pain - Plan: PT plan of care cert/re-cert  Left shoulder pain - Plan: PT plan of care cert/re-cert  Generalized weakness - Plan: PT plan of care cert/re-cert  Stiffness of left shoulder joint - Plan: PT plan of care cert/re-cert      Subjective Assessment -  01/09/15 1538    Symptoms Patient began experiencing left shoulder pain and neck in July last year. It hurts when turning pain. Intermittent pain down left arm and feels numbness in third digit.   Limitations Sitting   How long can you sit comfortably? 30 min   Patient Stated Goals Decrease pain and fell better. Get my ROM back in my arm.   Currently in Pain? Yes   Pain Score 4    Pain Location Neck  intermittently at hairline   Pain Orientation Posterior   Pain Descriptors / Indicators Aching   Pain Type Chronic pain   Pain Radiating Towards left UE   Pain Onset More than a month ago   Pain Frequency Constant   Aggravating Factors  driving, turning head, prolonged sitting   Pain Relieving Factors lay down flat or support head in sitting   Multiple Pain Sites Yes   Pain Score 6   Pain Location Shoulder   Pain Orientation Left   Pain Descriptors / Indicators Aching;Sharp  intermittent sharp pain top of shouldre to armpit   Pain Type Chronic pain   Pain Onset More than a month ago   Pain Frequency Constant   Aggravating Factors  carrying purse    Pain Relieving Factors rest            [facility] PT Assessment - 01/09/15 0001    Assessment   Medical Diagnosis  left cervical radiculopathy   Onset Date 04/29/15   Next MD Visit 02/03/15   Prior Therapy no   Precautions   Precautions None   Balance Screen   Has the patient fallen in the past 6 months No   Has the patient had a decrease in activity level because of a fear of falling?  No   Is the patient reluctant to leave their home because of a fear of falling?  No   Prior Function   Level of Independence Independent with basic ADLs   Observation/Other Assessments   Focus on Therapeutic Outcomes (FOTO)  47% limitation   ROM / Strength   AROM / PROM / Strength AROM;PROM;Strength   AROM   AROM Assessment Site Shoulder;Cervical   Right/Left Shoulder Left   Left Shoulder Flexion 128 Degrees   Left Shoulder ABduction 145 Degrees    Left Shoulder Internal Rotation 45 Degrees   Left Shoulder External Rotation 22 Degrees   Cervical Flexion --  full   Cervical Extension --  full   Cervical - Right Side Bend 13   Cervical - Left Side Bend 22   Cervical - Right Rotation --  full   Cervical - Left Rotation 70   PROM   PROM Assessment Site Shoulder   Right/Left Shoulder Left   Left Shoulder Flexion 140 Degrees   Left Shoulder ABduction 90 Degrees   Left Shoulder Internal Rotation 75 Degrees   Left Shoulder External Rotation 28 Degrees   Strength   Overall Strength --  cerv WNL   Strength Assessment Site Shoulder   Right/Left Shoulder Left   Left Shoulder Flexion 4+/5   Left Shoulder Extension 5/5   Left Shoulder ABduction 4-/5   Left Shoulder Internal Rotation 5/5   Left Shoulder External Rotation 4+/5   Palpation   Palpation pain with PA mobs to left C5,6,7    Special Tests    Special Tests Cervical;Rotator Cuff Impingement;Sacrolliac Tests   Cervical Tests Spurling's;Dictraction;other   Rotator Cuff Impingment tests Neer impingement test;Hawkins- Kennedy test;Empty Can test   Spurling's   Findings Negative   Side Left   Comment bil   Distraction Test   Findngs Positive   other    Findings --  Compression negative   Neer Impingement test    Findings Positive   Hawkins-Kennedy test   Findings Negative   Empty Can test   Findings Positive                   [facility] Adult PT Treatment/Exercise - 01/09/15 0001    Exercises   Exercises Neck;Shoulder   Neck Exercises: Seated   Neck Retraction 10 reps   Modalities   Modalities Electrical Stimulation;Moist Heat   Moist Heat Therapy   Number Minutes Moist Heat 15 Minutes   Moist Heat Location Other (comment)  neck   Electrical Stimulation   Electrical Stimulation Location ch 1 neck and ch2 left shoulder   Electrical Stimulation Action Premod   Electrical Stimulation Parameters x 15 min   Electrical Stimulation Goals Pain   Neck  Exercises: Stretches   Upper Trapezius Stretch 1 rep;30 seconds  bil   Levator Stretch 1 rep;30 seconds  bil                PT Education - 01/09/15 1636    Education provided Yes   Education Details HEP   Person(s) Educated Patient   Methods Explanation;Demonstration;Handout   Comprehension Verbalized understanding;Returned demonstration  PT Short Term Goals - 01/09/15 1645    PT SHORT TERM GOAL #1   Title I with initial HEP   Time 3   Period Weeks   Status New           PT Long Term Goals - 01/09/15 1646    PT LONG TERM GOAL #1   Title I with advanced HEP   Time 6   Period Weeks   Status New   PT LONG TERM GOAL #2   Title demo improved [facility] to [facility] to perform ADLs   Time 6   Period Weeks   Status New   PT LONG TERM GOAL #3   Title report decreased pain in the neck and left UE to 1/10 or less with ADLs   Time 6   Period Weeks   Status New   PT LONG TERM GOAL #4   Title report decreased pain and numbness in left UE by 80% or more.   Time 6   Period Weeks   Status New   PT LONG TERM GOAL #5   Title improved FOTO to 38%   Time 6   Period Weeks   Status New   Additional Long Term Goals   Additional Long Term Goals Yes   PT LONG TERM GOAL #6   Title Pt able to drive and check blind spots without increased pain in the neck.   Time 6   Period Weeks   Status New               Plan - 01/09/15 1636    Clinical Impression Statement Pt presents with complaints of neck and left shoulder pain starting last July. Patient has decreased cervical and shoulder ROM. She has intermittent numbness and pain down the left UE to her hand and decreased cspine mobility. She also shows signs of RC impingement in the left shoulder.   Pt will benefit from skilled therapeutic intervention in order to improve on the following deficits Decreased range of motion;Impaired flexibility;Postural dysfunction;Pain;Impaired UE functional use;Decreased  mobility;Decreased strength   Rehab Potential Excellent   PT Frequency 2x / week   PT Duration 6 weeks   PT Treatment/Interventions ADLs/Self Care Home Management;Moist Heat;Patient/family education;Traction;Therapeutic exercise;Manual techniques;Ultrasound;Cryotherapy;Neuromuscular re-education;Electrical Stimulation;Other (comment)  iontophoresis 4mg /ml dexamethasone   PT Next Visit Plan Review HEP, shoulder ROM, cervical mobs, traction   Consulted and Agree with Plan of Care Patient         Problem List Patient Active Problem List   Diagnosis Date Noted  . Annual physical exam 12/27/2014  . Left cervical radiculopathy 10/17/2014  . Obesity, Class III, BMI 40-49.9 (morbid obesity) 06/06/2014  . S/P laparoscopic sleeve gastrectomy 06/03/2014  . OSA (obstructive sleep apnea) 03/20/2014  . Depression 07/23/2013  . Weight gain 07/23/2013  . History of cervical dysplasia 07/23/2013    Roque Schill 01/09/2015, 4:58 PM  [facility] [address] [place], [place], [address] Phone: [phone]   Fax:  [phone]

## 2015-01-09 NOTE — Therapy (Signed)
[facility] [place] [address] [place], [place], [address] Phone: [phone]   Fax:  [phone]  Physical Therapy Evaluation  Patient Details  Name: Brittany Neal MRN: [id number] Date of Birth: 27-Jun-1961 Referring Provider:  Monica Becton,*  Encounter Date: 01/09/2015      PT End of Session - 01/09/15 1624    Visit Number 1   Number of Visits 12   Date for PT Re-Evaluation 02/20/15   PT Start Time 1534   PT Stop Time 1638   PT Time Calculation (min) 64 min   Activity Tolerance Patient tolerated treatment well   Behavior During Therapy [facility] for tasks assessed/performed      Past Medical History  Diagnosis Date  . CIN II (cervical intraepithelial neoplasia II)   . Depression   . ADHD (attention deficit hyperactivity disorder)   . PONV (postoperative nausea and vomiting)   . Vitamin D deficiency   . Headache(784.0)     migraines  . Anxiety     stress related    Past Surgical History  Procedure Laterality Date  . Knee surgery Left 2000  . Dilation and curettage of uterus  2007  . Abdominal hysterectomy  2007  . Laparoscopic gastric sleeve resection N/A 06/03/2014    Procedure: LAPAROSCOPIC GASTRIC SLEEVE RESECTION WITH HIATAL HERNIA  REPAIR;  Surgeon: Atilano Ina, MD;  Location: [facility];  Service: General;  Laterality: N/A;  . Colonoscopy N/A 10/15/2014    Procedure: COLONOSCOPY;  Surgeon: Romie Levee, MD;  [facility];  Service: Endoscopy;  Laterality: N/A;    There were no vitals filed for this visit.  Visit Diagnosis:  Cervical disc disorder with radiculopathy of cervical region - Plan: PT plan of care cert/re-cert  Neck pain - Plan: PT plan of care cert/re-cert  Left shoulder pain - Plan: PT plan of care cert/re-cert  Generalized weakness - Plan: PT plan of care cert/re-cert  Stiffness of left shoulder joint - Plan: PT plan of care cert/re-cert      Subjective Assessment -  01/09/15 1538    Symptoms Patient began experiencing left shoulder pain and neck in July last year. It hurts when turning pain. Intermittent pain down left arm and feels numbness in third digit.   Limitations Sitting   How long can you sit comfortably? 30 min   Patient Stated Goals Decrease pain and feel better. Get my ROM back in my arm.   Currently in Pain? Yes   Pain Score 4    Pain Location Neck  intermittently at hairline   Pain Orientation Posterior   Pain Descriptors / Indicators Aching   Pain Type Chronic pain   Pain Radiating Towards left UE   Pain Onset More than a month ago   Pain Frequency Constant   Aggravating Factors  driving, turning head, prolonged sitting   Pain Relieving Factors lay down flat or support head in sitting   Multiple Pain Sites Yes   Pain Score 6   Pain Location Shoulder   Pain Orientation Left   Pain Descriptors / Indicators Aching;Sharp  intermittent sharp pain top of shouldre to armpit   Pain Type Chronic pain   Pain Onset More than a month ago   Pain Frequency Constant   Aggravating Factors  carrying purse    Pain Relieving Factors rest            [facility] PT Assessment - 01/09/15 0001    Assessment   Medical Diagnosis  left cervical radiculopathy   Onset Date 04/29/15   Next MD Visit 02/03/15   Prior Therapy no   Precautions   Precautions None   Balance Screen   Has the patient fallen in the past 6 months No   Has the patient had a decrease in activity level because of a fear of falling?  No   Is the patient reluctant to leave their home because of a fear of falling?  No   Prior Function   Level of Independence Independent with basic ADLs   Observation/Other Assessments   Focus on Therapeutic Outcomes (FOTO)  47% limitation   ROM / Strength   AROM / PROM / Strength AROM;PROM;Strength   AROM   AROM Assessment Site Shoulder;Cervical   Right/Left Shoulder Left   Left Shoulder Flexion 128 Degrees   Left Shoulder ABduction 145 Degrees    Left Shoulder Internal Rotation 45 Degrees   Left Shoulder External Rotation 22 Degrees   Cervical Flexion --  full   Cervical Extension --  full   Cervical - Right Side Bend 13   Cervical - Left Side Bend 22   Cervical - Right Rotation --  full   Cervical - Left Rotation 70   PROM   PROM Assessment Site Shoulder   Right/Left Shoulder Left   Left Shoulder Flexion 140 Degrees   Left Shoulder ABduction 90 Degrees   Left Shoulder Internal Rotation 75 Degrees   Left Shoulder External Rotation 28 Degrees   Strength   Overall Strength --  cerv WNL   Strength Assessment Site Shoulder   Right/Left Shoulder Left   Left Shoulder Flexion 4+/5   Left Shoulder Extension 5/5   Left Shoulder ABduction 4-/5   Left Shoulder Internal Rotation 5/5   Left Shoulder External Rotation 4+/5   Palpation   Palpation pain with PA mobs to left C5,6,7    Special Tests    Special Tests Cervical;Rotator Cuff Impingement;Sacrolliac Tests   Cervical Tests Spurling's;Dictraction;other   Rotator Cuff Impingment tests Neer impingement test;Hawkins- Kennedy test;Empty Can test   Spurling's   Findings Negative   Side Left   Comment bil   Distraction Test   Findngs Positive   other    Findings --  Compression negative   Neer Impingement test    Findings Positive   Hawkins-Kennedy test   Findings Negative   Empty Can test   Findings Positive                   [facility] Adult PT Treatment/Exercise - 01/09/15 0001    Exercises   Exercises Neck;Shoulder   Neck Exercises: Seated   Neck Retraction 10 reps   Modalities   Modalities Electrical Stimulation;Moist Heat   Moist Heat Therapy   Number Minutes Moist Heat 15 Minutes   Moist Heat Location Other (comment)  neck   Electrical Stimulation   Electrical Stimulation Location ch 1 neck and ch2 left shoulder   Electrical Stimulation Action Premod   Electrical Stimulation Parameters x 15 min   Electrical Stimulation Goals Pain   Neck  Exercises: Stretches   Upper Trapezius Stretch 1 rep;30 seconds  bil   Levator Stretch 1 rep;30 seconds  bil                PT Education - 01/09/15 1636    Education provided Yes   Education Details HEP   Person(s) Educated Patient   Methods Explanation;Demonstration;Handout   Comprehension Verbalized understanding;Returned demonstration  PT Short Term Goals - 01/09/15 1645    PT SHORT TERM GOAL #1   Title I with initial HEP   Time 3   Period Weeks   Status New           PT Long Term Goals - 01/09/15 1646    PT LONG TERM GOAL #1   Title I with advanced HEP   Time 6   Period Weeks   Status New   PT LONG TERM GOAL #2   Title demo improved left shoulder [facility] to [facility] to perform [facility]s   Time 6   Period Weeks   Status New   PT LONG TERM GOAL #3   Title report decreased pain in the neck and left UE to 1/10 or less with ADLs   Time 6   Period Weeks   Status New   PT LONG TERM GOAL #4   Title report decreased pain and numbness in left UE by 80% or more.   Time 6   Period Weeks   Status New   PT LONG TERM GOAL #5   Title improved FOTO to 38%   Time 6   Period Weeks   Status New   Additional Long Term Goals   Additional Long Term Goals Yes   PT LONG TERM GOAL #6   Title Pt able to drive and check blind spots without increased pain in the neck.   Time 6   Period Weeks   Status New               Plan - 01/09/15 1636    Clinical Impression Statement Pt presents with complaints of neck and left shoulder pain starting last July. Patient has decreased cervical and shoulder ROM. She has intermittent numbness and pain down the left UE to her hand and decreased cspine mobility. She also shows signs of RC impingement in the left shoulder.   Pt will benefit from skilled therapeutic intervention in order to improve on the following deficits Decreased range of motion;Impaired flexibility;Postural dysfunction;Pain;Impaired UE functional use;Decreased  mobility;Decreased strength   Rehab Potential Excellent   PT Frequency 2x / week   PT Duration 6 weeks   PT Treatment/Interventions ADLs/Self Care Home Management;Moist Heat;Patient/family education;Traction;Therapeutic exercise;Manual techniques;Ultrasound;Cryotherapy;Neuromuscular re-education;Electrical Stimulation;Other (comment)  iontophoresis 4mg /ml dexamethasone   PT Next Visit Plan Review HEP, shoulder ROM, cervical mobs, traction   Consulted and Agree with Plan of Care Patient         Problem List Patient Active Problem List   Diagnosis Date Noted  . Annual physical exam 12/27/2014  . Left cervical radiculopathy 10/17/2014  . Obesity, Class III, BMI 40-49.9 (morbid obesity) 06/06/2014  . S/P laparoscopic sleeve gastrectomy 06/03/2014  . OSA (obstructive sleep apnea) 03/20/2014  . Depression 07/23/2013  . Weight gain 07/23/2013  . History of cervical dysplasia 07/23/2013   Solon PalmJulie Mari Battaglia, PT 01/09/2015 6:20 PM [facility] [address] [place], [place], [address] Phone: [phone]   Fax:  [phone]

## 2015-01-20 ENCOUNTER — Encounter: Payer: PRIVATE HEALTH INSURANCE | Admitting: Physical Therapy

## 2015-01-22 ENCOUNTER — Ambulatory Visit (INDEPENDENT_AMBULATORY_CARE_PROVIDER_SITE_OTHER): Payer: PRIVATE HEALTH INSURANCE | Admitting: Physical Therapy

## 2015-01-22 DIAGNOSIS — M25512 Pain in left shoulder: Secondary | ICD-10-CM

## 2015-01-22 DIAGNOSIS — R531 Weakness: Secondary | ICD-10-CM

## 2015-01-22 DIAGNOSIS — M5412 Radiculopathy, cervical region: Secondary | ICD-10-CM

## 2015-01-22 DIAGNOSIS — M7542 Impingement syndrome of left shoulder: Secondary | ICD-10-CM | POA: Diagnosis not present

## 2015-01-22 DIAGNOSIS — M542 Cervicalgia: Secondary | ICD-10-CM

## 2015-01-22 DIAGNOSIS — M25612 Stiffness of left shoulder, not elsewhere classified: Secondary | ICD-10-CM

## 2015-01-22 NOTE — Therapy (Signed)
[place], [place], [address] Phone: [phone]   Fax:  [phone]  Physical Therapy Treatment  Patient Details  Name: Brittany Neal MRN: [id number] Date of Birth: 1961-05-31 Referring Provider:  Silverio Decamp,*  Encounter Date: 01/22/2015      PT End of Session - 01/22/15 1614    Visit Number 2   Number of Visits 12   Date for PT Re-Evaluation 02/20/15   PT Start Time 5525   PT Stop Time 1627   PT Time Calculation (min) 53 min   Activity Tolerance Patient limited by pain      Past Medical History  Diagnosis Date  . CIN II (cervical intraepithelial neoplasia II)   . Depression   . ADHD (attention deficit hyperactivity disorder)   . PONV (postoperative nausea and vomiting)   . Vitamin D deficiency   . Headache(784.0)     migraines  . Anxiety     stress related    Past Surgical History  Procedure Laterality Date  . Knee surgery Left 2000  . Dilation and curettage of uterus  2007  . Abdominal hysterectomy  2007  . Laparoscopic gastric sleeve resection N/A 06/03/2014    Procedure: LAPAROSCOPIC GASTRIC SLEEVE RESECTION WITH HIATAL HERNIA  REPAIR;  Surgeon: Gayland Curry, MD;  [facility]: [facility];  Service: General;  Laterality: N/A;  . Colonoscopy N/A 10/15/2014    Procedure: COLONOSCOPY;  Surgeon: Leighton Ruff, MD;  [facility];  Service: Endoscopy;  Laterality: N/A;    There were no vitals filed for this visit.  Visit Diagnosis:  Neck pain  Left shoulder pain  Generalized weakness  Stiffness of left shoulder joint      Subjective Assessment - 01/22/15 1523    Subjective Pt was out of town last week and didnt' get to do all her exercise as much. States she is working on keeping upright posture with her shoulders back.    Patient Stated Goals Decrease pain and fell better. Get my ROM back in my arm.   Currently in Pain? Yes   Pain Score 2    Pain Location  Shoulder   Pain Orientation Left   Pain Descriptors / Indicators Nagging;Sore   Pain Type Chronic pain   Pain Radiating Towards Lt hand   Pain Onset More than a month ago   Pain Frequency Constant   Aggravating Factors  driving, reaching Lt UE behind, lying on Lt side   Pain Relieving Factors lying down             [facility] PT Assessment - 01/22/15 0001    AROM   Right/Left Shoulder Left   Left Shoulder Flexion 139 Degrees   Left Shoulder ABduction 158 Degrees   Left Shoulder External Rotation 45 Degrees                   [facility] Adult PT Treatment/Exercise - 01/22/15 0001    Neck Exercises: Supine   Cervical Isometrics 20 reps  head presses   Shoulder Exercises: Supine   Other Supine Exercises 20 reps shoulder presses in hooklying   Shoulder Exercises: Standing   Other Standing Exercises 20 reps shoulder shrugs   Shoulder Exercises: Pulleys   Flexion 2 minutes   ABduction 2 minutes   Shoulder Exercises: ROM/Strengthening   UBE (Upper Arm Bike) L1x 2'/2'   Shoulder Exercises: Isometric Strengthening   Flexion --  10x5sec   Extension --  10x5sec  External Rotation --  10x5sec   Internal Rotation --  10x5sec   ABduction --  10x5sec   Modalities   Modalities Electrical Stimulation;Cryotherapy   Cryotherapy   Number Minutes Cryotherapy 15 Minutes   Cryotherapy Location Neck;Shoulder  Lt   Type of Cryotherapy Ice pack   Electrical Stimulation   Electrical Stimulation Location Lt neck and shoulder   Electrical Stimulation Action IFC   Electrical Stimulation Parameters to tolerance   Electrical Stimulation Goals Pain   Manual Therapy   Manual Therapy Joint mobilization   Joint Mobilization Lt shoulder, AP mobs grade III and stretching into ER.                 PT Education - 01/22/15 1543    Education provided Yes   Education Details HEP   Person(s) Educated Patient   Methods Explanation;Handout   Comprehension Returned demonstration           PT Short Term Goals - 01/22/15 1618    PT SHORT TERM GOAL #1   Title I with initial HEP   Status Achieved           PT Long Term Goals - 01/22/15 1618    PT LONG TERM GOAL #1   Title I with advanced HEP   Status On-going   PT LONG TERM GOAL #2   Title demo improved left shoulder ROM to [facility] to perform ADLs   Status Partially Met   PT LONG TERM GOAL #3   Title report decreased pain in the neck and left UE to 1/10 or less with ADLs   Status On-going   PT LONG TERM GOAL #4   Title report decreased pain and numbness in left UE by 80% or more.   Status On-going   PT LONG TERM GOAL #5   Title improved FOTO to 38%   Status On-going   PT LONG TERM GOAL #6   Title Pt able to drive and check blind spots without increased pain in the neck.   Status On-going               Plan - 01/22/15 1616    Clinical Impression Statement Only second visit, no change per patient however she does have improve Lt shoulder AROM, pain in Lt shoulder and into middle finger still limit her.    Pt will benefit from skilled therapeutic intervention in order to improve on the following deficits Decreased range of motion;Impaired flexibility;Postural dysfunction;Pain;Impaired UE functional use;Decreased mobility;Decreased strength   Rehab Potential Excellent   PT Frequency 2x / week   PT Duration 6 weeks   PT Treatment/Interventions ADLs/Self Care Home Management;Moist Heat;Patient/family education;Traction;Therapeutic exercise;Manual techniques;Ultrasound;Cryotherapy;Neuromuscular re-education;Electrical Stimulation;Other (comment)   PT Next Visit Plan assess tolerance to new HEP   Consulted and Agree with Plan of Care Patient        Problem List Patient Active Problem List   Diagnosis Date Noted  . Annual physical exam 12/27/2014  . Left cervical radiculopathy 10/17/2014  . Obesity, Class III, BMI 40-49.9 (morbid obesity) 06/06/2014  . S/P laparoscopic sleeve gastrectomy  06/03/2014  . OSA (obstructive sleep apnea) 03/20/2014  . Depression 07/23/2013  . Weight gain 07/23/2013  . History of cervical dysplasia 07/23/2013    Jeral Pinch, PT 01/22/2015, 4:19 PM  [facility] [place], [place], [address] Phone: [phone]   Fax:  [phone]

## 2015-01-22 NOTE — Patient Instructions (Signed)
  Strengthening: Isometric Flexion   Using wall for resistance, press right fist into ball using light pressure. Hold _5___ seconds. Repeat _10___ times per set. Do _1___ sets per session. Do 1-2____ sessions per day.  http://orth.exer.us/800   Copyright  VHI. All rights reserved.  Strengthening: Isometric Extension   Using wall for resistance, press back of left arm into ball using light pressure. Hold __5__ seconds. Repeat __10__ times per set. Do _1___ sets per session. Do _1-2___ sessions per day.  http://orth.exer.us/804   Copyright  VHI. All rights reserved.  Strengthening: Isometric Abduction   Using wall for resistance, press left arm into ball using light pressure. Hold _5___ seconds. Repeat _10___ times per set. Do _1__ sets per session. Do 1-2____ sessions per day.  http://orth.exer.us/806   Copyright  VHI. All rights reserved.  Strengthening: Isometric External Rotation   Using wall to provide resistance, and keeping right arm at side, press back of hand into ball using light pressure. Hold _5___ seconds. Repeat __10__ times per set. Do _1___ sets per session. Do __1-2__ sessions per day.  http://orth.exer.us/814   Copyright  VHI. All rights reserved.  Strengthening: Isometric Internal Rotation   Using door frame for resistance, press palm of right hand into ball using light pressure. Keep elbow in at side. Hold _5___ seconds. Repeat 10_ times per set. Do __1__ sets per session. Do _1-2___ sessions per day.  http://orth.exer.us/816   [place] [phone]

## 2015-01-24 ENCOUNTER — Ambulatory Visit (INDEPENDENT_AMBULATORY_CARE_PROVIDER_SITE_OTHER): Payer: PRIVATE HEALTH INSURANCE | Admitting: Sports Medicine

## 2015-01-24 ENCOUNTER — Encounter: Payer: Self-pay | Admitting: Sports Medicine

## 2015-01-24 VITALS — BP 100/64 | HR 84 | Ht 63.0 in | Wt 175.0 lb

## 2015-01-24 DIAGNOSIS — M5412 Radiculopathy, cervical region: Secondary | ICD-10-CM | POA: Diagnosis not present

## 2015-01-24 DIAGNOSIS — M7542 Impingement syndrome of left shoulder: Secondary | ICD-10-CM | POA: Insufficient documentation

## 2015-01-24 NOTE — Progress Notes (Signed)
  Subjective:    CC: Follow-up  HPI: Left C7 radiculopathy: Improved significantly with physical therapy. Would like to continue PT, has not yet plateaued.  Left shoulder pain: Did not respond to a subacromial injection in a previous visit, continues to have pain in her shoulder, localized anteriorly. Moderate, persistent without radiation.  Past medical history, Surgical history, Family history not pertinant except as noted below, Social history, Allergies, and medications have been entered into the medical record, reviewed, and no changes needed.   Review of Systems: No fevers, chills, night sweats, weight loss, chest pain, or shortness of breath.   Objective:    General: Well Developed, well nourished, and in no acute distress.  Neuro: Alert and oriented x3, extra-ocular muscles intact, sensation grossly intact.  HEENT: Normocephalic, atraumatic, pupils equal round reactive to light, neck supple, no masses, no lymphadenopathy, thyroid nonpalpable.  Skin: Warm and dry, no rashes. Cardiac: Regular rate and rhythm, no murmurs rubs or gallops, no lower extremity edema.  Respiratory: Clear to auscultation bilaterally. Not using accessory muscles, speaking in full sentences. Left Shoulder: Inspection reveals no abnormalities, atrophy or asymmetry. Palpation is normal with no tenderness over AC joint or bicipital groove. ROM is full in all planes. Rotator cuff strength normal throughout. No signs of impingement with negative Neer and Hawkin's tests, empty can. Speeds and Yergason's tests normal. Positive crank test. Normal scapular function observed. No painful arc and no drop arm sign. No apprehension sign  Procedure: Real-time Ultrasound Guided Injection of left glenohumeral joint Device: GE Logiq E  Verbal informed consent obtained.  Time-out conducted.  Noted no overlying erythema, induration, or other signs of local infection.  Skin prepped in a sterile fashion.  Local  anesthesia: Topical Ethyl chloride.  With sterile technique and under real time ultrasound guidance:  Using a posterior approach I inserted a spinal needle into the glenohumeral joint taking care to avoid the labrum, 1 mL kenalog 40, 4 mL lidocaine injected easily. Completed without difficulty  Pain immediately resolved suggesting accurate placement of the medication.  Advised to call if fevers/chills, erythema, induration, drainage, or persistent bleeding.  Images permanently stored and available for review in the ultrasound unit.  Impression: Technically successful ultrasound guided injection.  Impression and Recommendations:

## 2015-01-24 NOTE — Assessment & Plan Note (Signed)
Left C7 has improved significantly, continues to have mild radicular pain. She would like to do a bit more physical therapy before considering advanced imaging.

## 2015-01-24 NOTE — Assessment & Plan Note (Addendum)
Persistent pain despite injection. Injection was subacromial, today we will do a glenohumeral type injection. Patient had better relief after a glenohumeral injection and she did after the subacromial injection. This suggests that the pathology is either glenohumeral or articular sided rotator cuff. She will also do physical therapy for her shoulder. Return in one month, MR if no better.

## 2015-01-27 ENCOUNTER — Encounter: Payer: Self-pay | Admitting: Physical Therapy

## 2015-01-28 ENCOUNTER — Ambulatory Visit: Payer: PRIVATE HEALTH INSURANCE

## 2015-01-29 ENCOUNTER — Ambulatory Visit (INDEPENDENT_AMBULATORY_CARE_PROVIDER_SITE_OTHER): Payer: PRIVATE HEALTH INSURANCE | Admitting: Family Medicine

## 2015-01-29 ENCOUNTER — Ambulatory Visit (INDEPENDENT_AMBULATORY_CARE_PROVIDER_SITE_OTHER): Payer: PRIVATE HEALTH INSURANCE | Admitting: Physical Therapy

## 2015-01-29 ENCOUNTER — Encounter: Payer: Self-pay | Admitting: Family Medicine

## 2015-01-29 ENCOUNTER — Encounter: Payer: Self-pay | Admitting: Physical Therapy

## 2015-01-29 VITALS — BP 108/67 | HR 79 | Wt 171.0 lb

## 2015-01-29 DIAGNOSIS — R531 Weakness: Secondary | ICD-10-CM

## 2015-01-29 DIAGNOSIS — E538 Deficiency of other specified B group vitamins: Secondary | ICD-10-CM | POA: Diagnosis not present

## 2015-01-29 DIAGNOSIS — M5412 Radiculopathy, cervical region: Secondary | ICD-10-CM

## 2015-01-29 DIAGNOSIS — M25512 Pain in left shoulder: Secondary | ICD-10-CM

## 2015-01-29 DIAGNOSIS — M5010 Cervical disc disorder with radiculopathy, unspecified cervical region: Secondary | ICD-10-CM

## 2015-01-29 DIAGNOSIS — M25612 Stiffness of left shoulder, not elsewhere classified: Secondary | ICD-10-CM

## 2015-01-29 DIAGNOSIS — M542 Cervicalgia: Secondary | ICD-10-CM

## 2015-01-29 DIAGNOSIS — M7542 Impingement syndrome of left shoulder: Secondary | ICD-10-CM

## 2015-01-29 MED ORDER — CYANOCOBALAMIN 1000 MCG/ML IJ SOLN
1000.0000 ug | Freq: Once | INTRAMUSCULAR | Status: AC
  Administered 2015-01-29: 1000 ug via INTRAMUSCULAR

## 2015-01-29 NOTE — Patient Instructions (Addendum)
Strengthening: Resisted Internal Rotation   Hold tubing in left hand, elbow at side and forearm out. Rotate forearm in across body. Repeat _10___ times per set. Do __2__ sets per session. Do _1___ sessions per day.  http://orth.exer.us/830   Strengthening: Resisted External Rotation   Hold tubing in right hand, elbow at side and forearm across body Use a towel under your elbow. Rotate forearm out. Repeat _10___ times per set. Do _2___ sets per session. Do __1__ sessions per day.  http://orth.exer.us/828   Strengthening: Resisted Flexion   Hold tubing with left arm at side. Pull forward and up. Move shoulder through pain-free range of motion. Repeat ____ times per set. Do ____ sets per session. Do ____ sessions per day.  http://orth.exer.us/824  Strengthening: Resisted Extension   Hold tubing in right hand, arm forward. Pull arm back, elbow straight. Repeat _10___ times per set. Do __2__ sets per session. Do __1__ sessions per day.  http://orth.exer.us/832   Copyright  VHI. All rights reserved.  [place]  [phone]

## 2015-01-29 NOTE — Progress Notes (Signed)
Patient was in office today for B12 injection.1000 mcg was given right deltoid. Patient denied anything abnormal. Brittany Neal,CMA

## 2015-01-29 NOTE — Therapy (Addendum)
[place] [place] [address] [place], [place], [address] Phone: [phone]   Fax:  [phone]  Physical Therapy Treatment  Patient Details  Name: Brittany Neal MRN: [id number] Date of Birth: Dec 01, 1960 Referring Provider:  Silverio Decamp,*  Encounter Date: 01/29/2015      PT End of Session - 01/29/15 1519    Visit Number 3   Number of Visits 12   Date for PT Re-Evaluation 02/20/15   PT Start Time 0802   PT Stop Time 1619   PT Time Calculation (min) 62 min   Activity Tolerance Patient tolerated treatment well      Past Medical History  Diagnosis Date  . CIN II (cervical intraepithelial neoplasia II)   . Depression   . ADHD (attention deficit hyperactivity disorder)   . PONV (postoperative nausea and vomiting)   . Vitamin D deficiency   . Headache(784.0)     migraines  . Anxiety     stress related    Past Surgical History  Procedure Laterality Date  . Knee surgery Left 2000  . Dilation and curettage of uterus  2007  . Abdominal hysterectomy  2007  . Laparoscopic gastric sleeve resection N/A 06/03/2014    Procedure: LAPAROSCOPIC GASTRIC SLEEVE RESECTION WITH HIATAL HERNIA  REPAIR;  Surgeon: Gayland Curry, MD;  Location: [facility];  Service: General;  Laterality: N/A;  . Colonoscopy N/A 10/15/2014    Procedure: COLONOSCOPY;  Surgeon: Leighton Ruff, MD;  Location: [facility];  Service: Endoscopy;  Laterality: N/A;    There were no vitals filed for this visit.  Visit Diagnosis:  No diagnosis found.      Subjective Assessment - 01/29/15 1519    Subjective Saw MD on Friday and had an injection, she has had decrease in pain in the Lt shoulder now. Able to sleep better not too   Currently in Pain? Yes   Pain Score 2    Pain Location Shoulder   Pain Orientation Left   Pain Descriptors / Indicators Dull   Pain Type Chronic pain   Pain Onset More than a month ago   Pain Frequency Intermittent   Pain  Relieving Factors injection            [facility] PT Assessment - 01/29/15 0001    AROM   Left Shoulder Flexion 168 Degrees   Left Shoulder External Rotation 70 Degrees                   [facility] Adult PT Treatment/Exercise - 01/29/15 0001    Neck Exercises: Machines for Strengthening   UBE (Upper Arm Bike) L2x 3'/3'   Neck Exercises: Supine   Cervical Isometrics --  30 reps head presses   Other Supine Exercise POE neck diagonals with laser glasses 2x10   Shoulder Exercises: Standing   External Rotation Strengthening;Left;Theraband  2x10   Theraband Level (Shoulder External Rotation) Level 2 (Red)   Internal Rotation Strengthening;Left;Theraband  2x10   Theraband Level (Shoulder Internal Rotation) Level 2 (Red)   Flexion Strengthening;Left;Theraband  2x10   Theraband Level (Shoulder Flexion) Level 2 (Red)   Extension Strengthening;Left;Theraband  2x10   Theraband Level (Shoulder Extension) Level 2 (Red)   Modalities   Modalities Electrical Stimulation;Moist Heat   Moist Heat Therapy   Number Minutes Moist Heat 15 Minutes   Moist Heat Location Other (comment)   Electrical Stimulation   Electrical Stimulation Location Lt neck and shoulder   Electrical Stimulation Action IFC  Electrical Stimulation Parameters to tolerance   Electrical Stimulation Goals Pain   Manual Therapy   Manual Therapy Joint mobilization   Joint Mobilization Lt shoulder, AP mobs grade III and stretching into ER.  manual cervical traction with grade II-III mobs CPA and bilat UPA in supine.                 PT Education - 01/29/15 1528    Education provided Yes   Education Details HEP   Person(s) Educated Patient   Methods Explanation;Handout   Comprehension Verbalized understanding          PT Short Term Goals - 01/29/15 1520    PT SHORT TERM GOAL #1   Title I with initial HEP   Status Achieved           PT Long Term Goals - 01/29/15 1521    PT LONG TERM GOAL #1    Title I with advanced HEP   Status On-going   PT LONG TERM GOAL #2   Title demo improved left shoulder ROM to [facility] to perform ADLs   PT LONG TERM GOAL #3   Title report decreased pain in the neck and left UE to 1/10 or less with ADLs   Status On-going   PT LONG TERM GOAL #4   Title report decreased pain and numbness in left UE by 80% or more.   Status Achieved   PT LONG TERM GOAL #5   Title improved FOTO to 38%   Status On-going   PT LONG TERM GOAL #6   Title Pt able to drive and check blind spots without increased pain in the neck.   Status On-going               Problem List Patient Active Problem List   Diagnosis Date Noted  . Impingement syndrome of left shoulder 01/24/2015  . Annual physical exam 12/27/2014  . Left cervical radiculopathy 10/17/2014  . Obesity, Class III, BMI 40-49.9 (morbid obesity) 06/06/2014  . S/P laparoscopic sleeve gastrectomy 06/03/2014  . OSA (obstructive sleep apnea) 03/20/2014  . Depression 07/23/2013  . Weight gain 07/23/2013  . History of cervical dysplasia 07/23/2013    Jeral Pinch, PT 01/29/2015, 4:08 PM  [facility] [place], [place], [address] Phone: [phone]   Fax:  [phone]    PHYSICAL THERAPY DISCHARGE SUMMARY  Visits from Start of Care: 3 Current functional level related to goals / functional outcomes: unknown   Remaining deficits: unknown   Education / Equipment: Initial HEP Plan: Patient agrees to discharge.  Patient goals were partially met. Patient is being discharged due to the patient's request. Her daughter is very sick and she needs to tend to her right now ?????   Jeral Pinch, PT  02/20/15 [phone]

## 2015-02-03 ENCOUNTER — Encounter: Payer: PRIVATE HEALTH INSURANCE | Admitting: Physical Therapy

## 2015-02-05 ENCOUNTER — Encounter: Payer: PRIVATE HEALTH INSURANCE | Admitting: Physical Therapy

## 2015-02-10 ENCOUNTER — Encounter: Payer: PRIVATE HEALTH INSURANCE | Admitting: Physical Therapy

## 2015-02-12 ENCOUNTER — Encounter: Payer: PRIVATE HEALTH INSURANCE | Admitting: Physical Therapy

## 2015-02-17 ENCOUNTER — Encounter: Payer: PRIVATE HEALTH INSURANCE | Admitting: Physical Therapy

## 2015-02-19 ENCOUNTER — Encounter: Payer: PRIVATE HEALTH INSURANCE | Admitting: Physical Therapy

## 2015-02-21 ENCOUNTER — Ambulatory Visit: Payer: PRIVATE HEALTH INSURANCE | Admitting: Sports Medicine

## 2015-03-12 ENCOUNTER — Ambulatory Visit (INDEPENDENT_AMBULATORY_CARE_PROVIDER_SITE_OTHER): Payer: PRIVATE HEALTH INSURANCE | Admitting: Sports Medicine

## 2015-03-12 ENCOUNTER — Encounter: Payer: Self-pay | Admitting: Sports Medicine

## 2015-03-12 VITALS — BP 122/72 | HR 82 | Ht 63.0 in | Wt 166.0 lb

## 2015-03-12 DIAGNOSIS — M7542 Impingement syndrome of left shoulder: Secondary | ICD-10-CM

## 2015-03-12 DIAGNOSIS — E538 Deficiency of other specified B group vitamins: Secondary | ICD-10-CM

## 2015-03-12 DIAGNOSIS — W57XXXA Bitten or stung by nonvenomous insect and other nonvenomous arthropods, initial encounter: Secondary | ICD-10-CM | POA: Insufficient documentation

## 2015-03-12 MED ORDER — DOXYCYCLINE HYCLATE 100 MG PO TABS: 100.0000 mg | ORAL_TABLET | Freq: Two times a day (BID) | ORAL | Status: AC

## 2015-03-12 MED ORDER — CYANOCOBALAMIN 1000 MCG/ML IJ SOLN
1000.0000 ug | Freq: Once | INTRAMUSCULAR | Status: AC
  Administered 2015-03-12: 1000 ug via INTRAMUSCULAR

## 2015-03-12 NOTE — Patient Instructions (Signed)
Ehrlichiosis and Anaplasmosis Ehrlichiosis and anaplasmosis are diseases caused by bacteria and carried by ticks. Other names for these infections are:  Human monocytic ehrlichiosis (HME).  Human granulocytotropic anaplasmosis (HGA). HME mostly occurs in the [place] and [place], where the lone star tick lives. However, infections have occurred in 72 states. HGA infections are limited to fewer geographic locations. Most cases are reported from [place], [place], [place], [place], and [place]. This distribution is almost identical to that of Lyme disease because of the shared species of ixodid ticks (wood ticks, deer ticks). CAUSES   HME is caused by Ehrlichia chaffeensis and other closely related ehrlichia bacteria.  HGA is caused by the bacteria Anaplasma phagocytophilum. An infected adult tick transmits the infection by biting a human. Once a tick gains access to human skin, it generally climbs upward until it reaches a more protected area. This is often the back of the knee, groin, navel, armpit, ears, or nape of the neck. It then begins the slow process of embedding itself in the skin. Adult ticks are active during warmer times of the year. For this reason, most infections occur between late spring and early fall. SYMPTOMS  Many infected people have no symptoms. For those with symptoms, HME and HGA cause similar illnesses. Symptoms typically begin 1 week or more after a tick bite and may include:  Fever.  Headache.  Chills or shaking.  Fatigue.  Muscle pain.  Nausea.  Loss of appetite.  Vomiting.  Diarrhea. Symptoms commonly last for 1 to 3 weeks if a patient is not diagnosed or not treated with an antibiotic. Extremely severe disease is rare, but occasional deaths from infection have been reported. DIAGNOSIS  Diagnosis is suggested by a history of tick bites or potential exposure to ticks. Blood tests may show abnormalities of liver  function and low counts of white blood cells and platelets. To confirm the diagnosis, the bacteria must be found in a smear of blood on a microscope slide or during testing of the liquid part of your blood (serum). TREATMENT  Treatment with an antibiotic is almost always effective in eliminating symptoms within a couple days and curing the infection.  PREVENTION Ticks prefer to hide in shady, moist ground. However, they can often be found above the ground clinging to tall grass, brush, shrubs, and low tree branches. They also inhabit lawns and gardens, especially at the edges of woodlands and around old stone walls. Within the normal geographic areas where HME and HGA occur, no vegetated area can be considered completely free of infected ticks. In tick-infested areas, the best precaution against infection is to avoid contact with soil, leaf litter, and vegetation as much as possible. Campers, hikers, field workers, and others who spend time in wooded, brushy, or tall grassy areas can avoid exposure to ticks by using the following precautions:  Wear light-colored clothing with a tight weave to spot ticks more easily and prevent contact with the skin.  Wear long pants tucked into socks, long-sleeve shirts tucked into pants, and enclosed shoes or boots.  Use insect repellent. Spray clothes with insect repellent containing either DEET or permethrin. Only DEET can be used on exposed skin. Make sure to follow the manufacturer's directions carefully.  Wear a hat and keep long hair pulled back.  Stay on cleared, well-worn trails whenever possible.  Check yourself and others frequently for the presence of ticks on clothes. If you find one tick, there may be more. Check thoroughly.  Remove clothes after leaving tick-infested areas and, if possible, wash them to eliminate any unseen ticks. Check yourself, your children, and any pets from head to toe for the presence of ticks.  When attached ticks are found,  you can greatly reduce your chances of getting HME and HGA if you remove them as soon as possible. Use a tweezers to grab hold of the tick by its mouth parts and pull it off.  Shower and shampoo after possible exposure to ticks. HOME CARE INSTRUCTIONS Take your antibiotics as directed. Finish them even if you start to feel better. SEEK MEDICAL CARE IF:   You have a fever.  You develop a headache.  You develop fatigue.  You develop muscle pain.  You develop nausea, vomiting, or diarrhea. MAKE SURE YOU:  Understand these instructions.  Will watch your condition.  Will get help right away if you are not doing well or get worse. Document Released: 10/01/2000 Document Revised: 02/18/2014 Document Reviewed: 04/09/2011 [facility] Patient Information 2015 [employer] [place]. This information is not intended to replace advice given to you by your health care provider. Make sure you discuss any questions you have with your health care provider.

## 2015-03-12 NOTE — Assessment & Plan Note (Signed)
Over the right anterior knee. Picture of the tick looks like a Lone Star tick. There is a small amount of erythema, there is also a halo of bruising however this is probably not an erythema chronicum migrans-type rash. Lone star ticks can transmit human ehrlichiosis so we will check titers, and he doxycycline. Doxycycline is predominantly for what appears to be a low-grade cellulitis.

## 2015-03-12 NOTE — Progress Notes (Signed)
  Subjective:    CC: Tick bite  HPI: Left shoulder pain: Resolved after glenohumeral injection.  Tick bite: Occurred 4 days ago, some found a small rounded tic with a white spot on the dorsum attached to the anterior aspect of her right knee, she was ill remove the tick, and took some pictures of it. She then developed a rash, reddish, with a halo around it, and then another halo of bruising around that rash. No fevers, chills, myalgias, arthralgias.  Past medical history, Surgical history, Family history not pertinant except as noted below, Social history, Allergies, and medications have been entered into the medical record, reviewed, and no changes needed.   Review of Systems: No fevers, chills, night sweats, weight loss, chest pain, or shortness of breath.   Objective:    General: Well Developed, well nourished, and in no acute distress.  Neuro: Alert and oriented x3, extra-ocular muscles intact, sensation grossly intact.  HEENT: Normocephalic, atraumatic, pupils equal round reactive to light, neck supple, no masses, no lymphadenopathy, thyroid nonpalpable.  Skin: Warm and dry, there is a circular rash with a halo around it over her right knee approximately 4 cm across, around that is a halo of bruising. Because the second halo is bruising rather than erythema does not resemble the classic erythema chronica migrans rash of Lyme's disease. Cardiac: Regular rate and rhythm, no murmurs rubs or gallops, no lower extremity edema.  Respiratory: Clear to auscultation bilaterally. Not using accessory muscles, speaking in full sentences.  I was able to look at pictures of the tick on her cell phone, this appears to be Lone Star tick which is capable of transmitting human ehrlichiosis, but typically not Borrelia burgdorferi or Rickettsia Rickettsia infection.  Impression and Recommendations:

## 2015-03-12 NOTE — Assessment & Plan Note (Signed)
Minimal response to a subacromial injection but a complete relief of pain with good range of motion after glenohumeral injection, continue home rehabilitation exercises and return to see me on an as-needed basis for this.

## 2015-03-13 LAB — EHRLICHIA ANTIBODY PANEL
E chaffeensis (HGE) Ab, IgG: <1:64 {titer}
E chaffeensis (HGE) Ab, IgM: <1:20 {titer}

## 2015-03-13 LAB — ROCKY MTN SPOTTED FVR ABS PNL(IGG+IGM)
RMSF IgG: 0.11 IV
RMSF IgM: 0.63 IV

## 2015-03-13 LAB — B. BURGDORFI ANTIBODIES: B burgdorferi Ab IgG+IgM: 0.07 {ISR}

## 2015-04-09 ENCOUNTER — Ambulatory Visit: Payer: PRIVATE HEALTH INSURANCE | Admitting: Sports Medicine

## 2016-02-23 IMAGING — CR DG CERVICAL SPINE COMPLETE 4+V
7 series · 7 of 7 positions shown · non-contrast
Comparison: None.

CLINICAL DATA: Neck pain x3 months.

EXAM:
CERVICAL SPINE  4+ VIEWS

[view not recorded (1 of 7)]
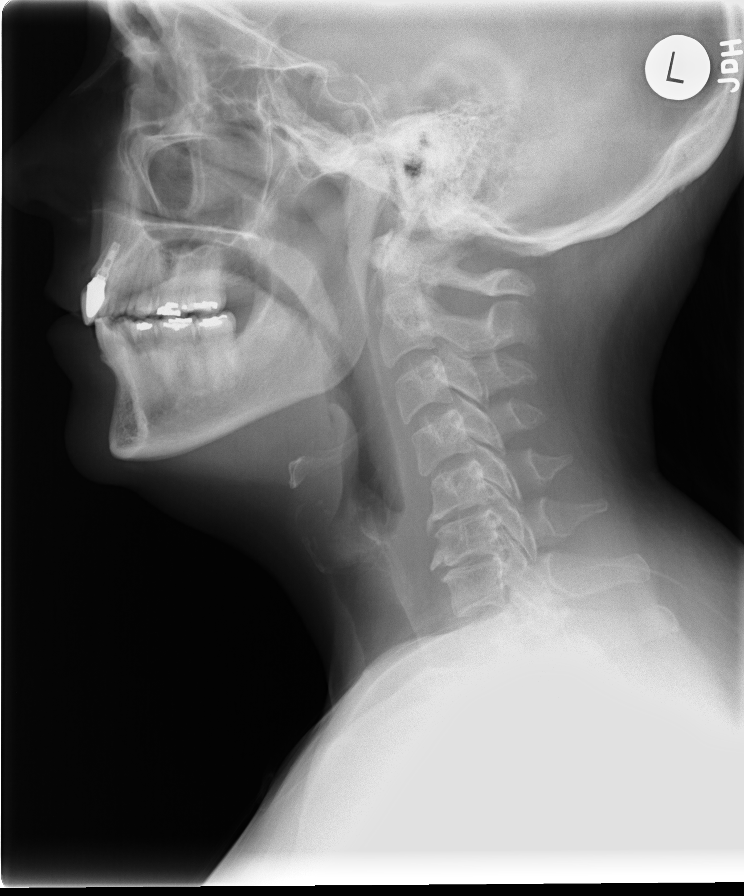

[view not recorded (2 of 7)]
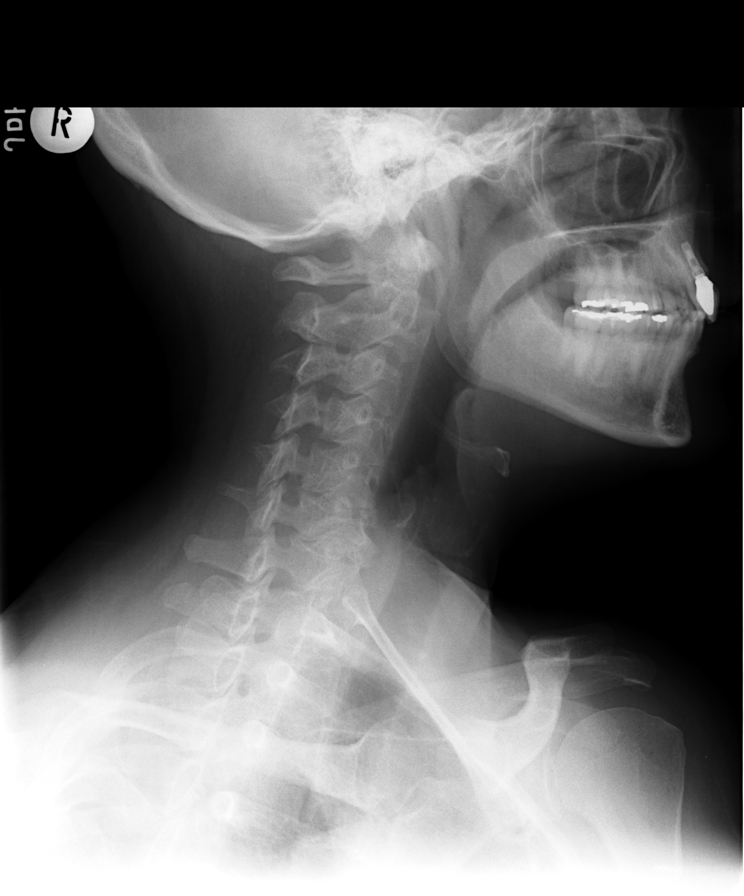

[view not recorded (3 of 7)]
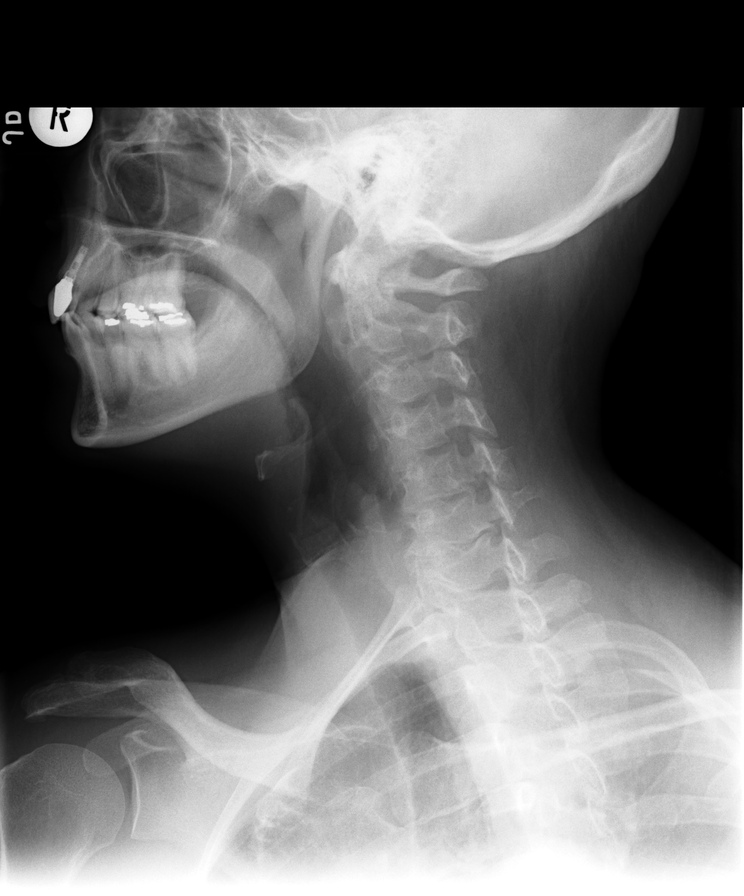

[view not recorded (4 of 7)]
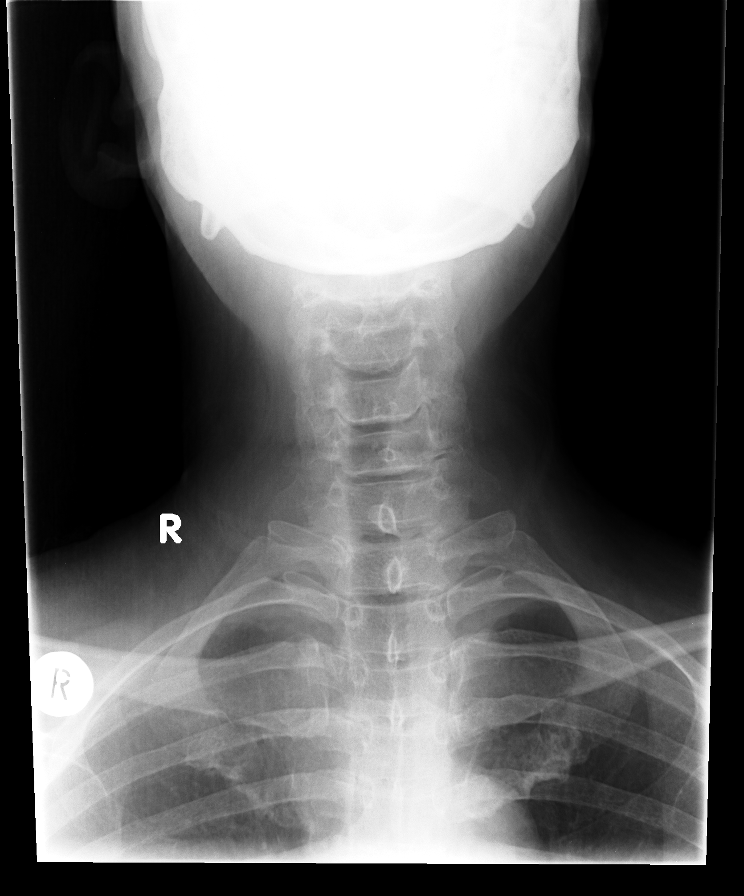

[view not recorded (5 of 7)]
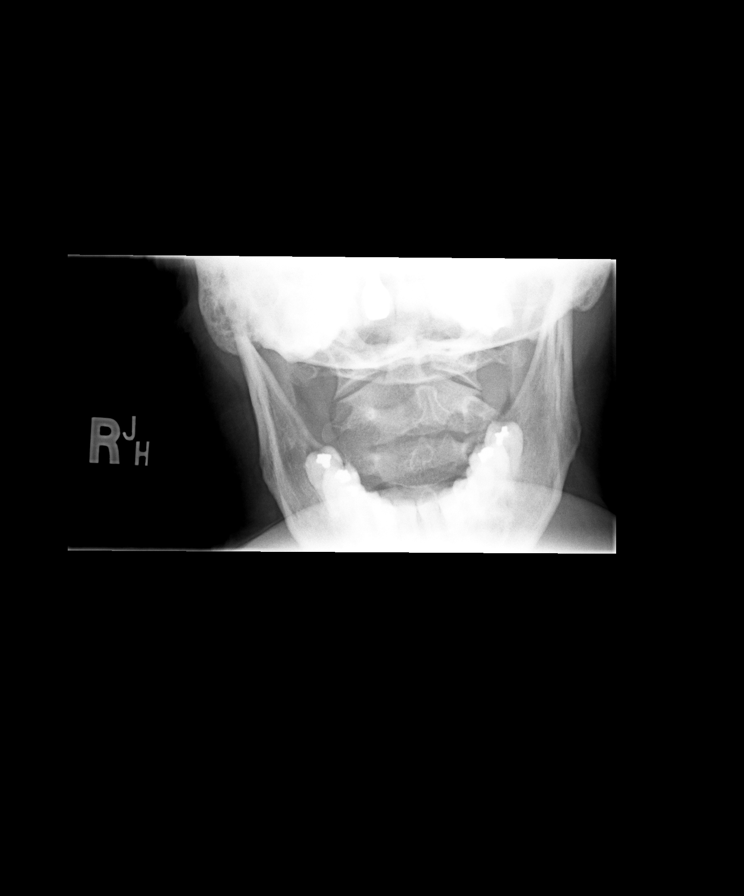

[view not recorded (6 of 7)]
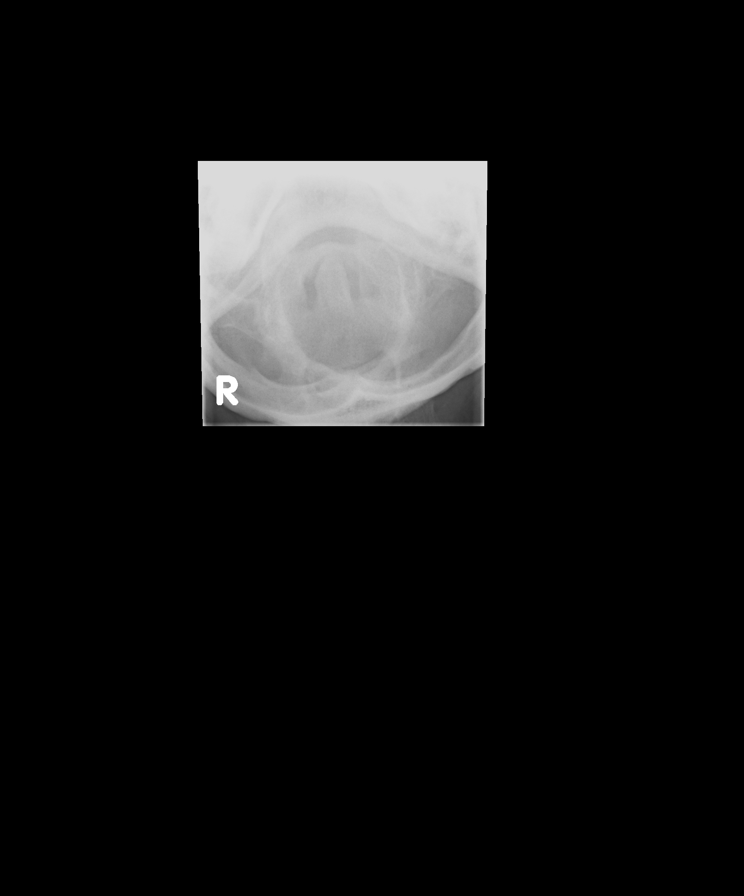

[view not recorded (7 of 7)]
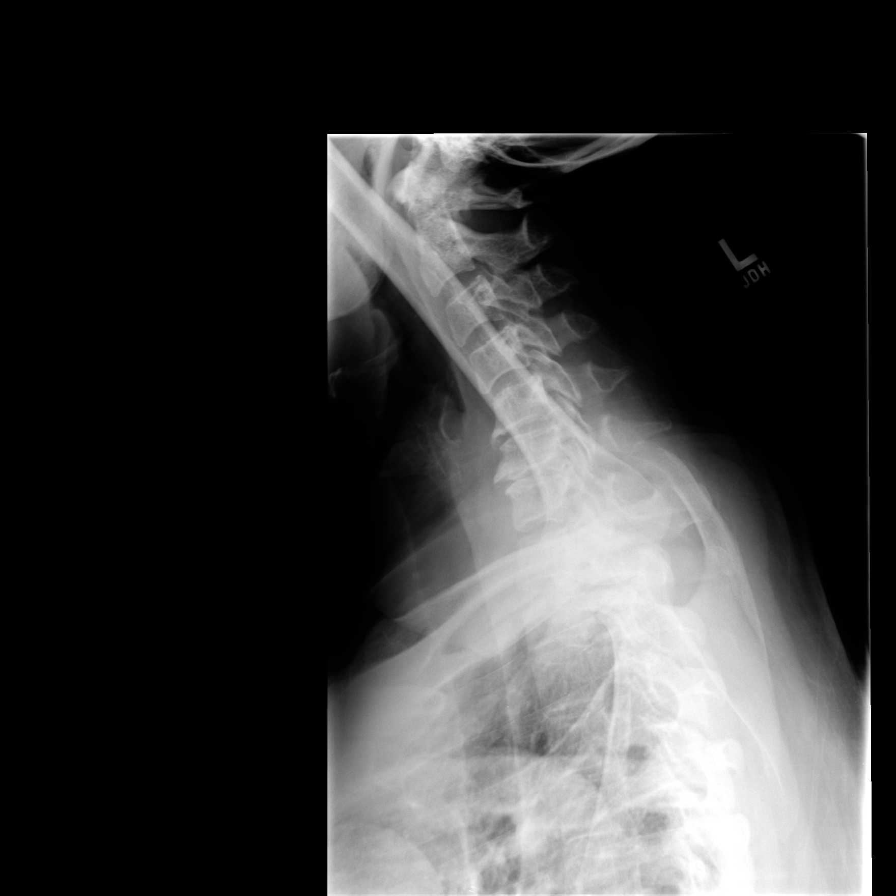

[7 of 7 positions shown; findings below may reference images not displayed]

FINDINGS: Loss of normal cervical lordosis. This could be related to
positioning and or torticollis. Degenerative changes cervical spine
with endplate osteophyte formation C5-C6, C6-C7, C7-T1. No acute
bony abnormality identified. No evidence of fracture dislocation.
Mild apical pleural thickening noted consistent with scarring.
IMPRESSION: Degenerative change cervical spine. No evidence of fracture or
dislocation.

## 2016-02-23 IMAGING — CR DG SHOULDER 2+V*L*
3 series · 3 of 3 positions shown · non-contrast
Comparison: None.

CLINICAL DATA: Onset of neck pain 3 months ago without known
trauma; symptoms are worsening and radiates down the left arm

EXAM:
LEFT SHOULDER - 2+ VIEW

[view not recorded (1 of 3)]
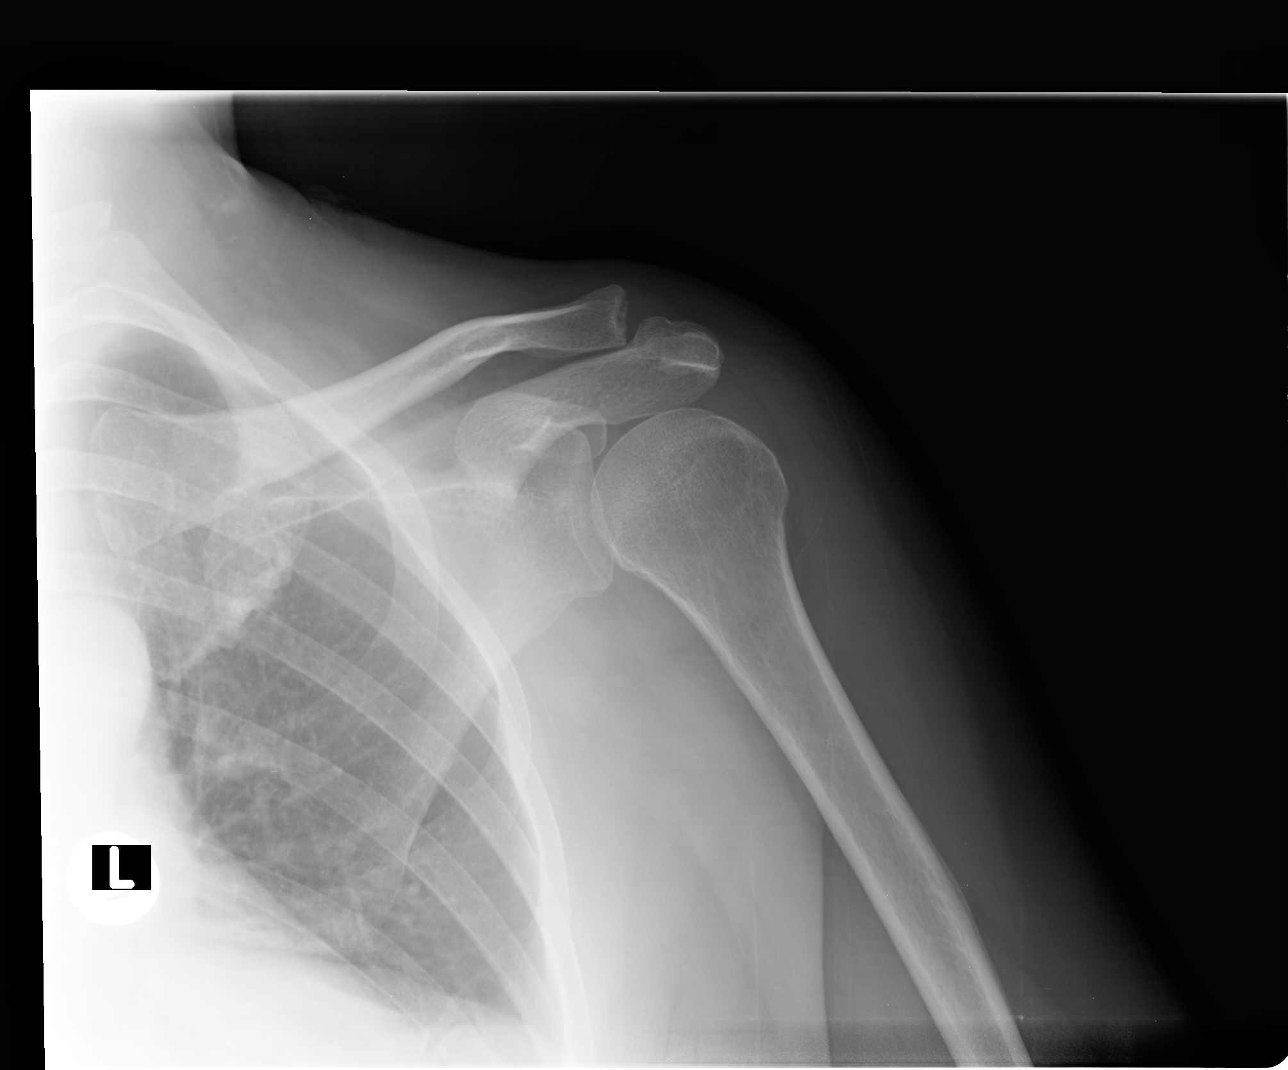

[view not recorded (2 of 3)]
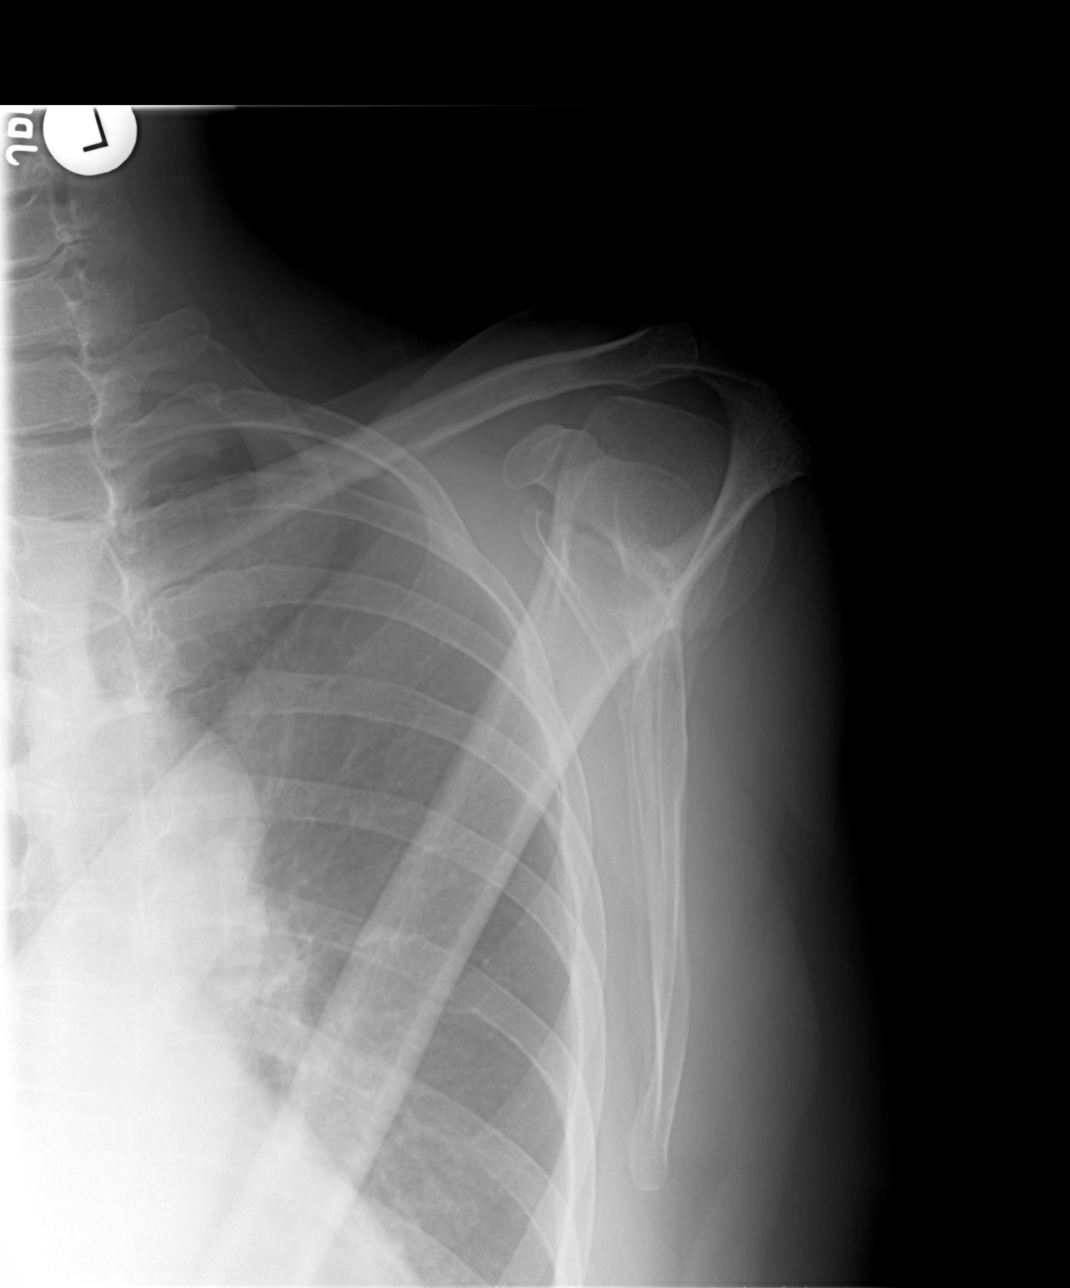

[view not recorded (3 of 3)]
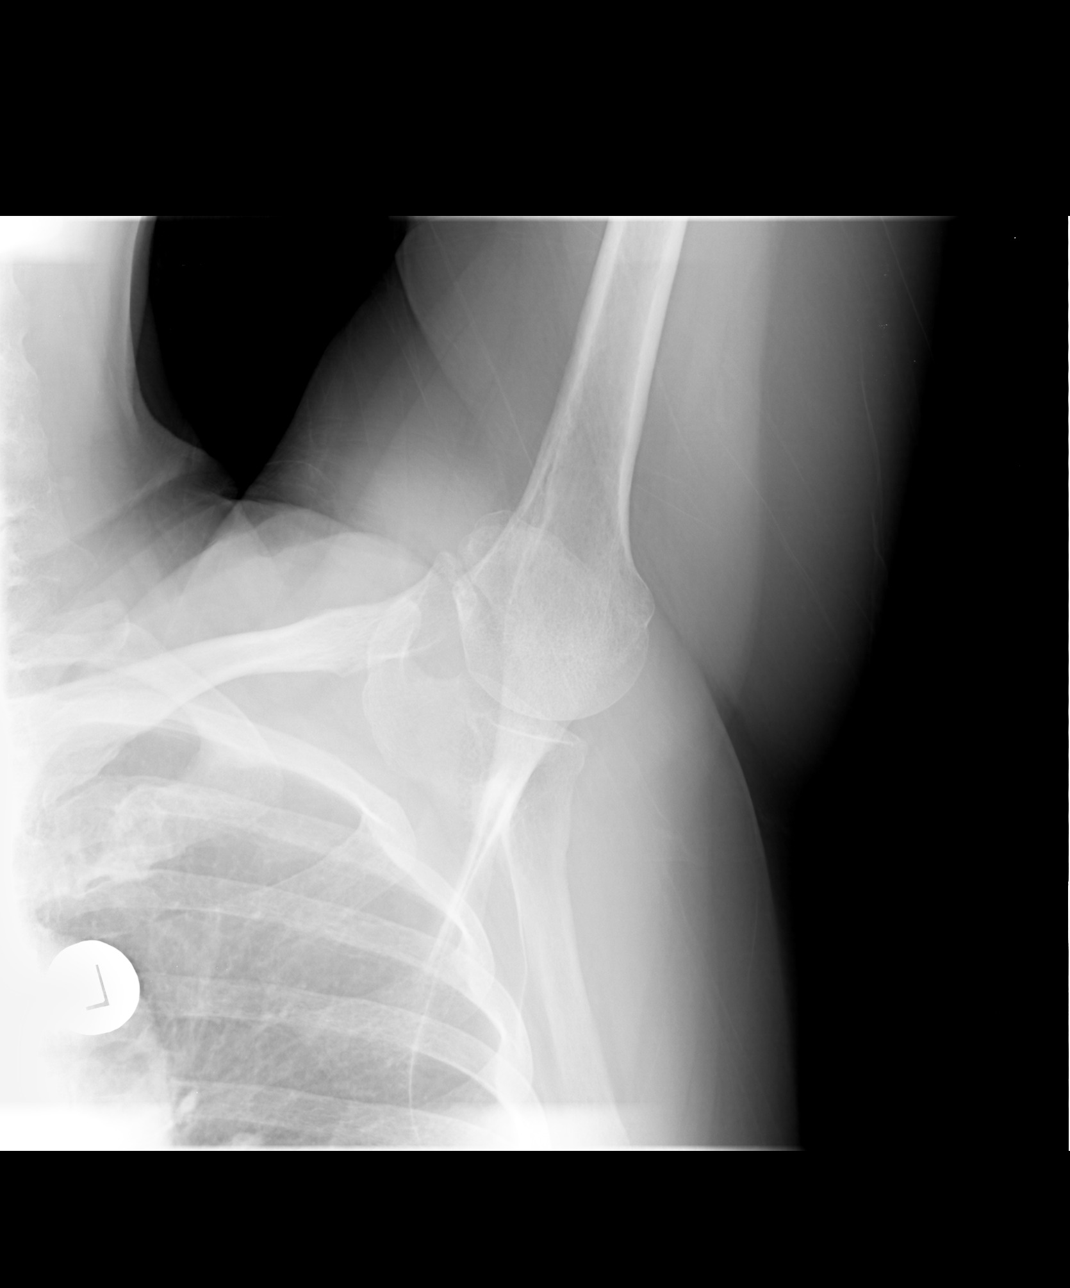

[3 of 3 positions shown; findings below may reference images not displayed]

FINDINGS: The bones of the left shoulder are adequately mineralized. There is
no acute or old fracture. The AC joint exhibits mild degenerative
change. The glenohumeral joint is unremarkable. The observed
portions of the left clavicle, scapula, and upper ribs are
unremarkable.
IMPRESSION: There are mild degenerative changes of the AC joint. There is no
acute bony abnormality.

## 2016-07-23 ENCOUNTER — Encounter (HOSPITAL_COMMUNITY): Payer: Self-pay

## 2016-08-09 ENCOUNTER — Encounter: Payer: Self-pay | Admitting: Sports Medicine

## 2016-08-09 ENCOUNTER — Ambulatory Visit (INDEPENDENT_AMBULATORY_CARE_PROVIDER_SITE_OTHER): Payer: BLUE CROSS/BLUE SHIELD | Admitting: Sports Medicine

## 2016-08-09 DIAGNOSIS — F339 Major depressive disorder, recurrent, unspecified: Secondary | ICD-10-CM

## 2016-08-09 MED ORDER — VORTIOXETINE HBR 10 MG PO TABS: 1.0000 | ORAL_TABLET | Freq: Every day | ORAL | 3 refills | Status: DC

## 2016-08-09 NOTE — Progress Notes (Signed)
  Subjective:    CC: Depressed mood  HPI: This is a pleasant 55 year old female, she comes in with a several week to month history of depressed mood with severe anhedonia, difficulty sleeping, poor energy, poor appetite, guilt, mild difficulty concentrating, psychomotor retardation and minimal thoughts of harming herself, also has severe difficulty controlling her worry, fear of impending doom, moderate trouble relaxing, irritability, and mild nervousness and restlessness. She has been on Wellbutrin in the past with minimal response, is very adamant about using a weight neutral antidepressant. She also was doing behavioral therapy, but has not some time. Does not know if she has the finances for this.  Past medical history:  Negative.  See flowsheet/record as well for more information.  Surgical history: Negative.  See flowsheet/record as well for more information.  Family history: Negative.  See flowsheet/record as well for more information.  Social history: Negative.  See flowsheet/record as well for more information.  Allergies, and medications have been entered into the medical record, reviewed, and no changes needed.   Review of Systems: No fevers, chills, night sweats, weight loss, chest pain, or shortness of breath.   Objective:    General: Well Developed, well nourished, and in no acute distress.  Neuro: Alert and oriented x3, extra-ocular muscles intact, sensation grossly intact.  HEENT: Normocephalic, atraumatic, pupils equal round reactive to light, neck supple, no masses, no lymphadenopathy, thyroid nonpalpable.  Skin: Warm and dry, no rashes. Cardiac: Regular rate and rhythm, no murmurs rubs or gallops, no lower extremity edema.  Respiratory: Clear to auscultation bilaterally. Not using accessory muscles, speaking in full sentences.  Impression and Recommendations:    Depression Fairly severe with concurrent adjustment disorder from a current separation, daughter having heart  surgery and moving back into the house, as well as other responsibilities. She is very concerned about weight gain with any of the antidepressants so we will use Trintellix. Return in one month for a PHQ9 and GAD7. Certainly we could add Wellbutrin at a future date if needed.  I spent 25 minutes with this patient, greater than 50% was face-to-face time counseling regarding the above diagnoses

## 2016-08-09 NOTE — Assessment & Plan Note (Signed)
Fairly severe with concurrent adjustment disorder from a current separation, daughter having heart surgery and moving back into the house, as well as other responsibilities. She is very concerned about weight gain with any of the antidepressants so we will use Trintellix. Return in one month for a PHQ9 and GAD7. Certainly we could add Wellbutrin at a future date if needed.

## 2016-08-16 ENCOUNTER — Telehealth: Payer: Self-pay | Admitting: *Deleted

## 2016-08-16 DIAGNOSIS — F339 Major depressive disorder, recurrent, unspecified: Secondary | ICD-10-CM

## 2016-08-16 NOTE — Telephone Encounter (Signed)
She has tried other SSRIs with intolerable weight gain in the past. Let us submit a PA, or resubmit with the appropriate information about trial and failure of Celexa/Lexapro/Zoloft.

## 2016-08-16 NOTE — Telephone Encounter (Signed)
Trintellix is not covered by plan. Had the option to submit a PA or go with alternative med. Submitted PA.this will most likely be denied. The insurance wants her to have tried at least one SSRI. Fluoxetine or citalopram is preffered. I know patient was concerned about weight, however Trintellix is not covered snd with coupon card the cost is $287.Would you like to go ahead and send prescription for FLuoxetine or Citalopram?

## 2016-08-17 NOTE — Telephone Encounter (Signed)
There is no documentation in the chart that she has tried and failed these medications  as far back as I can see.[facility].   I would need the dates she has been on the medication. In order to expedite this you can call the insurance company to do a peer to peer since the patient is not on anything currently and she has already left several messages with Leta. Keep in mind this med is NOT covered by the plan period and the PA was just kind of pulling at straws.Perhaps as the physician speaking directly to another you will have more persuasion.

## 2016-08-19 NOTE — Telephone Encounter (Signed)
This has been denied. If you would like to speak with pharmacist or physician in regard to this decision call [phone] Case ID [id number]

## 2016-08-20 MED ORDER — VORTIOXETINE HBR 10 MG PO TABS: 1.0000 | ORAL_TABLET | Freq: Every day | ORAL | 3 refills | Status: DC

## 2016-08-20 NOTE — Telephone Encounter (Signed)
Thanks and patient notified

## 2016-08-20 NOTE — Telephone Encounter (Signed)
I discussed this with Jocelyn with the appeals Department, the initial PA was denied due to not having not taken a generic previously.  It was recommended that I write an appeal letter, urgent, and this will get Trintellix covered.  This letter has been written and faxed.  Please let patient know I have appealed it.

## 2016-09-06 ENCOUNTER — Ambulatory Visit: Payer: BLUE CROSS/BLUE SHIELD | Admitting: Sports Medicine

## 2016-09-06 MED ORDER — BUPROPION HCL ER (XL) 150 MG PO TB24: 150.0000 mg | ORAL_TABLET | ORAL | 0 refills | Status: DC

## 2016-09-06 NOTE — Addendum Note (Signed)
Addended by: Monica BectonHEKKEKANDAM, THOMAS J on: 09/06/2016 04:49 PM   Modules accepted: Orders

## 2016-09-06 NOTE — Telephone Encounter (Signed)
Adding Wellbutrin for now.

## 2016-09-06 NOTE — Telephone Encounter (Signed)
Pt requesting new Rx since the current pending one is still not approved by insurance. Will route.

## 2016-09-07 MED ORDER — TOPIRAMATE 100 MG PO TABS: 100.0000 mg | ORAL_TABLET | Freq: Every morning | ORAL | 3 refills | Status: DC

## 2016-09-07 NOTE — Addendum Note (Signed)
Addended by: Monica BectonHEKKEKANDAM, Velva Molinari J on: 09/07/2016 08:46 AM   Modules accepted: Orders

## 2016-09-07 NOTE — Telephone Encounter (Signed)
Pt advised of new Rx. While on the phone she requested a refill on her Topamax Rx. This has never been written by PCP before so will route for review.

## 2016-09-07 NOTE — Telephone Encounter (Signed)
No problem, refilled. 

## 2016-09-07 NOTE — Addendum Note (Signed)
Addended by: Collie SiadICHARDSON, Baylen Dea M on: 09/07/2016 08:34 AM   Modules accepted: Orders

## 2016-11-26 ENCOUNTER — Telehealth: Payer: Self-pay | Admitting: Sports Medicine

## 2016-11-26 NOTE — Telephone Encounter (Signed)
Called and left message for patient to see if they have received or would like to receive their flu shot - CF

## 2017-02-24 ENCOUNTER — Encounter: Payer: Self-pay | Admitting: Gynecology

## 2017-02-24 ENCOUNTER — Ambulatory Visit (INDEPENDENT_AMBULATORY_CARE_PROVIDER_SITE_OTHER): Payer: BLUE CROSS/BLUE SHIELD | Admitting: Gynecology

## 2017-02-24 VITALS — BP 112/68 | Ht 63.5 in | Wt 157.4 lb

## 2017-02-24 DIAGNOSIS — Z01419 Encounter for gynecological examination (general) (routine) without abnormal findings: Secondary | ICD-10-CM | POA: Diagnosis not present

## 2017-02-24 DIAGNOSIS — Z780 Asymptomatic menopausal state: Secondary | ICD-10-CM

## 2017-02-24 DIAGNOSIS — Z1159 Encounter for screening for other viral diseases: Secondary | ICD-10-CM | POA: Diagnosis not present

## 2017-02-24 NOTE — Patient Instructions (Signed)
Bone Densitometry Bone densitometry is an imaging test that uses a special X-ray to measure the amount of calcium and other minerals in your bones (bone density). This test is also known as a bone mineral density test or dual-energy X-ray absorptiometry (DXA). The test can measure bone density at your hip and your spine. It is similar to having a regular X-ray. You may have this test to:  Diagnose a condition that causes weak or thin bones (osteoporosis).  Predict your risk of a broken bone (fracture).  Determine how well osteoporosis treatment is working. Tell a health care provider about:  Any allergies you have.  All medicines you are taking, including vitamins, herbs, eye drops, creams, and over-the-counter medicines.  Any problems you or family members have had with anesthetic medicines.  Any blood disorders you have.  Any surgeries you have had.  Any medical conditions you have.  Possibility of pregnancy.  Any other medical test you had within the previous 14 days that used contrast material. What are the risks? Generally, this is a safe procedure. However, problems can occur and may include the following:  This test exposes you to a very small amount of radiation.  The risks of radiation exposure may be greater to unborn children. What happens before the procedure?  Do not take any calcium supplements for 24 hours before having the test. You can otherwise eat and drink what you usually do.  Take off all metal jewelry, eyeglasses, dental appliances, and any other metal objects. What happens during the procedure?  You may lie on an exam table. There will be an X-ray generator below you and an imaging device above you.  Other devices, such as boxes or braces, may be used to position your body properly for the scan.  You will need to lie still while the machine slowly scans your body.  The images will show up on a computer monitor. What happens after the  procedure? You may need more testing at a later time. This information is not intended to replace advice given to you by your health care provider. Make sure you discuss any questions you have with your health care provider. Document Released: 10/26/2004 Document Revised: 03/11/2016 Document Reviewed: 03/14/2014 Elsevier Interactive Patient Education  2017 Elsevier Inc.  

## 2017-02-24 NOTE — Progress Notes (Signed)
Brittany Neal 07/04/1961 [id number]   History:    56 y.o.  for annual gyn exam who has not been seen the office since 2014 prior to that it had been 7 years. Review of her records indicated the following: 2002 at time of her pregnancy she had CIN-3 2003 endocervical curettage benign endocervix and attached squamous fragments of high-grade dysplasia  2003 the patient had cervical LEEP cone biopsies the pathology report was benign cervical mucosa with no residual high-grade dysplasia identified 2004 diagnostic hysteroscopy with dilatation and curettage lysis of  adhesion 2006 cervical dilatation and fractional D&C as a result of severe dysmenorrhea and cervical stenosis Endometrial biopsy with secretory endometrium In 2006 trans- vaginal hysterectomy with pathology report demonstrating squamous metaplasia, secretory endometrium. No evidence of hyperplasia or carcinoma. Adenomyosis, intramural leiomyomata. No dysplasia.  Last Pap smear 2014 was normal  Patient with past history of depression had been seeing a therapist and had been taking Wellbutrin. Patient states she had colonoscopy in 2016 was normal she is on a 10 year recall. She also stated in 2017 for which we have no record had an abnormal mammogram and was followed up with a three-dimensional mammogram and ultrasound was instructed to return back in 6 months which she will schedule the next few weeks. Patient has not had a bone density here.  Past medical history,surgical history, family history and social history were all reviewed and documented in the EPIC chart.  Gynecologic History No LMP recorded. Patient has had a hysterectomy. Contraception: status post hysterectomy Last Pap: 2014. Results were: normal Last mammogram: See above. Results were: See above  Obstetric History OB History  Gravida Para Term Preterm AB Living  3 2     1 2   SAB TAB Ectopic Multiple Live Births               # Outcome Date GA Lbr Len/2nd Weight  Sex Delivery Anes PTL Lv  3 AB           2 Para           1 Para                ROS: A ROS was performed and pertinent positives and negatives are included in the history.  GENERAL: No fevers or chills. HEENT: No change in vision, no earache, sore throat or sinus congestion. NECK: No pain or stiffness. CARDIOVASCULAR: No chest pain or pressure. No palpitations. PULMONARY: No shortness of breath, cough or wheeze. GASTROINTESTINAL: No abdominal pain, nausea, vomiting or diarrhea, melena or bright red blood per rectum. GENITOURINARY: No urinary frequency, urgency, hesitancy or dysuria. MUSCULOSKELETAL: No joint or muscle pain, no back pain, no recent trauma. DERMATOLOGIC: No rash, no itching, no lesions. ENDOCRINE: No polyuria, polydipsia, no heat or cold intolerance. No recent change in weight. HEMATOLOGICAL: No anemia or easy bruising or bleeding. NEUROLOGIC: No headache, seizures, numbness, tingling or weakness. PSYCHIATRIC: No depression, no loss of interest in normal activity or change in sleep pattern.     Exam: chaperone present  There were no vitals taken for this visit.  There is no height or weight on file to calculate BMI.  General appearance : Well developed well nourished female. No acute distress HEENT: Eyes: no retinal hemorrhage or exudates,  Neck supple, trachea midline, no carotid bruits, no thyroidmegaly Lungs: Clear to auscultation, no rhonchi or wheezes, or rib retractions  Heart: Regular rate and rhythm, no murmurs or gallops Breast:Examined in sitting and supine  position were symmetrical in appearance, no palpable masses or tenderness,  no skin retraction, no nipple inversion, no nipple discharge, no skin discoloration, no axillary or supraclavicular lymphadenopathy Abdomen: no palpable masses or tenderness, no rebound or guarding Extremities: no edema or skin discoloration or tenderness  Pelvic:  Bartholin, Urethra, Skene Glands: Within normal limits              Vagina: No gross lesions or discharge  Cervix: Absent  Uterus  Absent  Adnexa  Without masses or tenderness  Anus and perineum  normal   Rectovaginal  normal sphincter tone without palpated masses or tenderness             Hemoccult cards will be provided     Assessment/Plan:  56 y.o. female for annual exam postmenopausal on no hormone replacement therapy no vasomotor symptoms no vaginal dryness. She'll be scheduled for bone density study here in the office the next few weeks. She's also due for her mammogram the next few weeks as well. According to the CDC guidelines she will need a hepatitis C screen which was ordered today. Because of her history of high-grade dysplasia passing Pap smear was done today. We discussed importance of calcium vitamin D and weightbearing exercise for osteoporosis prevention.   Ok EdwardsFERNANDEZ,Marin Milley H MD, 12:06 PM 02/24/2017

## 2017-02-25 LAB — PAP IG W/ RFLX HPV ASCU

## 2017-02-25 LAB — HEPATITIS C ANTIBODY: HCV AB: NEGATIVE

## 2017-03-02 ENCOUNTER — Encounter: Payer: Self-pay | Admitting: Gynecology

## 2017-03-07 ENCOUNTER — Other Ambulatory Visit: Payer: Self-pay | Admitting: Gynecology

## 2017-03-07 ENCOUNTER — Ambulatory Visit (INDEPENDENT_AMBULATORY_CARE_PROVIDER_SITE_OTHER): Payer: BLUE CROSS/BLUE SHIELD

## 2017-03-07 DIAGNOSIS — M8588 Other specified disorders of bone density and structure, other site: Secondary | ICD-10-CM

## 2017-03-07 DIAGNOSIS — Z780 Asymptomatic menopausal state: Secondary | ICD-10-CM

## 2017-03-07 DIAGNOSIS — Z13820 Encounter for screening for osteoporosis: Secondary | ICD-10-CM | POA: Diagnosis not present

## 2017-04-07 ENCOUNTER — Encounter: Payer: Self-pay | Admitting: Gynecology

## 2018-01-17 ENCOUNTER — Encounter: Payer: Self-pay | Admitting: Sports Medicine

## 2018-01-17 ENCOUNTER — Ambulatory Visit (INDEPENDENT_AMBULATORY_CARE_PROVIDER_SITE_OTHER): Payer: Managed Care, Other (non HMO) | Admitting: Sports Medicine

## 2018-01-17 DIAGNOSIS — S5012XA Contusion of left forearm, initial encounter: Secondary | ICD-10-CM

## 2018-01-17 MED ORDER — BUPROPION HCL ER (XL) 150 MG PO TB24: 150.0000 mg | ORAL_TABLET | ORAL | 3 refills | Status: DC

## 2018-01-17 MED ORDER — GABAPENTIN 300 MG PO CAPS: ORAL_CAPSULE | ORAL | 3 refills | Status: DC
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
## 2018-02-28 ENCOUNTER — Ambulatory Visit (INDEPENDENT_AMBULATORY_CARE_PROVIDER_SITE_OTHER): Payer: Managed Care, Other (non HMO) | Admitting: Obstetrics & Gynecology

## 2018-02-28 ENCOUNTER — Encounter: Payer: Self-pay | Admitting: Obstetrics & Gynecology

## 2018-02-28 VITALS — BP 110/82 | Ht 63.0 in | Wt 175.0 lb

## 2018-02-28 DIAGNOSIS — M8588 Other specified disorders of bone density and structure, other site: Secondary | ICD-10-CM

## 2018-02-28 DIAGNOSIS — Z01419 Encounter for gynecological examination (general) (routine) without abnormal findings: Secondary | ICD-10-CM | POA: Diagnosis not present

## 2018-02-28 DIAGNOSIS — Z90710 Acquired absence of both cervix and uterus: Secondary | ICD-10-CM

## 2018-02-28 DIAGNOSIS — Z780 Asymptomatic menopausal state: Secondary | ICD-10-CM

## 2018-02-28 NOTE — Progress Notes (Signed)
Brittany Neal January 05, 1961 [id number]   History:    57 y.o. [id number]  RP:  Established patient presenting for annual gyn exam   HPI: S/P Total Hysterectomy.  No menopausal Sx.  No pelvic pain.  H/O CIN 3, LEEP 2003.  Pap negative in May 2018.  Urine and bowel movements normal.  Breasts normal.  Colonoscopy 2016.  Body mass index 31.  Past medical history,surgical history, family history and social history were all reviewed and documented in the EPIC chart.  Gynecologic History No LMP recorded. Patient has had a hysterectomy. Contraception: status post hysterectomy Last Pap: 02/2017. Results were: Negative Last mammogram: 03/2017. Results were: Benign after Rt breast US. Bone Density: 02/2017 Osteopenia at spine T-Score -1.4 Colonoscopy: 2016  Obstetric History OB History  Gravida Para Term Preterm AB Living  SAB TAB Ectopic Multiple Live Births               # Outcome Date GA Lbr Len/2nd Weight Sex Delivery Anes PTL Lv  3 AB           2 Para           1 Para              ROS: A ROS was performed and pertinent positives and negatives are included in the history.  GENERAL: No fevers or chills. HEENT: No change in vision, no earache, sore throat or sinus congestion. NECK: No pain or stiffness. CARDIOVASCULAR: No chest pain or pressure. No palpitations. PULMONARY: No shortness of breath, cough or wheeze. GASTROINTESTINAL: No abdominal pain, nausea, vomiting or diarrhea, melena or bright red blood per rectum. GENITOURINARY: No urinary frequency, urgency, hesitancy or dysuria. MUSCULOSKELETAL: No joint or muscle pain, no back pain, no recent trauma. DERMATOLOGIC: No rash, no itching, no lesions. ENDOCRINE: No polyuria, polydipsia, no heat or cold intolerance. No recent change in weight. HEMATOLOGICAL: No anemia or easy bruising or bleeding. NEUROLOGIC: No headache, seizures, numbness, tingling or weakness. PSYCHIATRIC: No depression, no loss of interest in normal activity or  change in sleep pattern.     Exam:   BP 110/82 (BP Location: Right Arm, Patient Position: Sitting, Cuff Size: Normal)   Ht  (1.6 m)   Wt 175 lb (79.4 kg)   BMI 31.00 kg/m   Body mass index is 31 kg/m.  General appearance : Well developed well nourished female. No acute distress HEENT: Eyes: no retinal hemorrhage or exudates,  Neck supple, trachea midline, no carotid bruits, no thyroidmegaly Lungs: Clear to auscultation, no rhonchi or wheezes, or rib retractions  Heart: Regular rate and rhythm, no murmurs or gallops Breast:Examined in sitting and supine position were symmetrical in appearance, no palpable masses or tenderness,  no skin retraction, no nipple inversion, no nipple discharge, no skin discoloration, no axillary or supraclavicular lymphadenopathy Abdomen: no palpable masses or tenderness, no rebound or guarding Extremities: no edema or skin discoloration or tenderness  Pelvic: Vulva: Normal             Vagina: No gross lesions or discharge.  Pap/HR HPV done.  Cervix/Uterus absent  Adnexa  Without masses or tenderness  Anus: Normal   Assessment/Plan:  57 y.o. female for annual exam   1. Encounter for routine gynecological examination with Papanicolaou smear of cervix Gynecologic exam status post hysterectomy.  Pap with high-risk HPV done at the vaginal vault.  Breast exam normal.  Will schedule a screening mammogram June  2019.  Colonoscopy 2016.  Health labs with family physician.  2. S/P total hysterectomy  3. Menopause present Well on no hormone replacement therapy.  4. Osteopenia of lumbar spine Vitamin D supplements, calcium rich nutrition and regular weightbearing physical activity recommended.  Will repeat bone density in May 2020.   Genia Del MD, 4:29 PM 02/28/2018

## 2018-03-02 LAB — PAP, TP IMAGING W/ HPV RNA, RFLX HPV TYPE 16,18/45: HPV DNA High Risk: NOT DETECTED

## 2018-03-03 ENCOUNTER — Encounter: Payer: Self-pay | Admitting: Obstetrics & Gynecology

## 2018-03-03 NOTE — Patient Instructions (Signed)
1. Encounter for routine gynecological examination with Papanicolaou smear of cervix Gynecologic exam status post hysterectomy.  Pap with high-risk HPV done at the vaginal vault.  Breast exam normal.  Will schedule a screening mammogram June 2019.  Colonoscopy 2016.  Health labs with family physician.  2. S/P total hysterectomy  3. Menopause present Well on no hormone replacement therapy.  4. Osteopenia of lumbar spine Vitamin D supplements, calcium rich nutrition and regular weightbearing physical activity recommended.  Will repeat bone density in May 2020.  Brittany Neal, it was a pleasure meeting you today!  I will inform you of your results as soon as they are available.

## 2018-04-09 ENCOUNTER — Other Ambulatory Visit: Payer: Self-pay | Admitting: Sports Medicine

## 2018-05-02 ENCOUNTER — Ambulatory Visit: Payer: 59 | Admitting: Clinical

## 2018-06-06 ENCOUNTER — Ambulatory Visit (INDEPENDENT_AMBULATORY_CARE_PROVIDER_SITE_OTHER): Payer: 59 | Admitting: Clinical

## 2018-06-06 DIAGNOSIS — F419 Anxiety disorder, unspecified: Secondary | ICD-10-CM

## 2018-06-13 ENCOUNTER — Ambulatory Visit (INDEPENDENT_AMBULATORY_CARE_PROVIDER_SITE_OTHER): Payer: 59 | Admitting: Clinical

## 2018-06-13 DIAGNOSIS — F419 Anxiety disorder, unspecified: Secondary | ICD-10-CM | POA: Diagnosis not present

## 2018-07-19 ENCOUNTER — Ambulatory Visit (INDEPENDENT_AMBULATORY_CARE_PROVIDER_SITE_OTHER): Payer: 59 | Admitting: Clinical

## 2018-07-19 DIAGNOSIS — F419 Anxiety disorder, unspecified: Secondary | ICD-10-CM | POA: Diagnosis not present

## 2018-07-24 ENCOUNTER — Ambulatory Visit (INDEPENDENT_AMBULATORY_CARE_PROVIDER_SITE_OTHER): Payer: 59 | Admitting: Clinical

## 2018-07-24 DIAGNOSIS — F419 Anxiety disorder, unspecified: Secondary | ICD-10-CM

## 2018-08-03 ENCOUNTER — Ambulatory Visit (INDEPENDENT_AMBULATORY_CARE_PROVIDER_SITE_OTHER): Payer: 59 | Admitting: Clinical

## 2018-08-03 DIAGNOSIS — F419 Anxiety disorder, unspecified: Secondary | ICD-10-CM | POA: Diagnosis not present

## 2018-08-10 ENCOUNTER — Ambulatory Visit (INDEPENDENT_AMBULATORY_CARE_PROVIDER_SITE_OTHER): Payer: 59 | Admitting: Clinical

## 2018-08-10 DIAGNOSIS — F419 Anxiety disorder, unspecified: Secondary | ICD-10-CM | POA: Diagnosis not present

## 2018-08-17 ENCOUNTER — Ambulatory Visit (INDEPENDENT_AMBULATORY_CARE_PROVIDER_SITE_OTHER): Payer: 59 | Admitting: Clinical

## 2018-08-17 DIAGNOSIS — F419 Anxiety disorder, unspecified: Secondary | ICD-10-CM | POA: Diagnosis not present

## 2018-08-24 ENCOUNTER — Ambulatory Visit: Payer: 59 | Admitting: Clinical

## 2018-08-31 ENCOUNTER — Ambulatory Visit (INDEPENDENT_AMBULATORY_CARE_PROVIDER_SITE_OTHER): Payer: 59 | Admitting: Clinical

## 2018-08-31 DIAGNOSIS — F419 Anxiety disorder, unspecified: Secondary | ICD-10-CM | POA: Diagnosis not present

## 2018-09-07 ENCOUNTER — Ambulatory Visit: Payer: 59 | Admitting: Clinical

## 2018-10-13 ENCOUNTER — Ambulatory Visit: Payer: 59 | Admitting: Clinical

## 2018-10-20 ENCOUNTER — Telehealth: Payer: Self-pay | Admitting: *Deleted

## 2018-10-20 NOTE — Telephone Encounter (Signed)
Patient reports she has not feeling well and was offered an appointment and declined at this time. She was offered an Visual merchandiser and if she was not feeling better next week she should call the office for an appointment.

## 2018-10-31 ENCOUNTER — Ambulatory Visit (INDEPENDENT_AMBULATORY_CARE_PROVIDER_SITE_OTHER): Payer: 59 | Admitting: Clinical

## 2018-10-31 DIAGNOSIS — F419 Anxiety disorder, unspecified: Secondary | ICD-10-CM

## 2018-11-16 ENCOUNTER — Ambulatory Visit: Payer: 59 | Admitting: Clinical

## 2018-11-23 ENCOUNTER — Other Ambulatory Visit: Payer: Self-pay | Admitting: Sports Medicine

## 2019-02-28 ENCOUNTER — Other Ambulatory Visit: Payer: Self-pay

## 2019-03-02 ENCOUNTER — Encounter: Payer: Managed Care, Other (non HMO) | Admitting: Obstetrics & Gynecology

## 2019-03-06 ENCOUNTER — Encounter: Payer: Self-pay | Admitting: Obstetrics & Gynecology

## 2019-03-06 ENCOUNTER — Ambulatory Visit (INDEPENDENT_AMBULATORY_CARE_PROVIDER_SITE_OTHER): Payer: Managed Care, Other (non HMO) | Admitting: Obstetrics & Gynecology

## 2019-03-06 ENCOUNTER — Other Ambulatory Visit: Payer: Self-pay

## 2019-03-06 VITALS — BP 108/70 | Ht 63.25 in | Wt 180.0 lb

## 2019-03-06 DIAGNOSIS — Z90710 Acquired absence of both cervix and uterus: Secondary | ICD-10-CM | POA: Diagnosis not present

## 2019-03-06 DIAGNOSIS — N898 Other specified noninflammatory disorders of vagina: Secondary | ICD-10-CM

## 2019-03-06 DIAGNOSIS — Z87410 Personal history of cervical dysplasia: Secondary | ICD-10-CM

## 2019-03-06 DIAGNOSIS — N951 Menopausal and female climacteric states: Secondary | ICD-10-CM

## 2019-03-06 DIAGNOSIS — Z113 Encounter for screening for infections with a predominantly sexual mode of transmission: Secondary | ICD-10-CM | POA: Diagnosis not present

## 2019-03-06 DIAGNOSIS — Z1272 Encounter for screening for malignant neoplasm of vagina: Secondary | ICD-10-CM

## 2019-03-06 DIAGNOSIS — Z01419 Encounter for gynecological examination (general) (routine) without abnormal findings: Secondary | ICD-10-CM | POA: Diagnosis not present

## 2019-03-06 DIAGNOSIS — F331 Major depressive disorder, recurrent, moderate: Secondary | ICD-10-CM | POA: Diagnosis not present

## 2019-03-06 DIAGNOSIS — D069 Carcinoma in situ of cervix, unspecified: Secondary | ICD-10-CM

## 2019-03-06 LAB — WET PREP FOR TRICH, YEAST, CLUE

## 2019-03-06 MED ORDER — ESTRADIOL 0.05 MG/24HR TD PTWK: 0.0500 mg | MEDICATED_PATCH | TRANSDERMAL | 4 refills | Status: DC

## 2019-03-06 MED ORDER — BUPROPION HCL ER (XL) 150 MG PO TB24: ORAL_TABLET | ORAL | 1 refills | Status: DC

## 2019-03-06 NOTE — Progress Notes (Signed)
Brittany Neal 09-20-1961 [id number]   History:    58 y.o. G3P2A1 Boyfriend  RP: Established patient presenting for annual gyn exam   HPI: S/P Total Hysterectomy.  Had a LEEP for CIN 3 in 2003.  Menopausal symptoms with hot flushes and night sweats preventing good sleep x 6 months.  Depressive symptoms worsening.  Decreased motivation.  Gaining weight.  Stopped Wellbutrin x 5 months.  No suicidal plan.  Increased vaginal d/c, no odor or itching.  No pain with IC.  No pelvic pain.  Breasts normal.  BMI 31.63.  Health labs with Fam MD.  Past medical history,surgical history, family history and social history were all reviewed and documented in the EPIC chart.  Gynecologic History No LMP recorded. Patient has had a hysterectomy. Contraception: status post hysterectomy Last Pap: 02/2018. Results were: Negative/HPV HR neg Last mammogram: 03/2017. Results were: D[facility] D, [facility].  Patient will schedule now. Bone Density: 02/2017 Osteopenia at the Spine -1.4 Colonoscopy: 2015  Obstetric History OB History  Gravida Para Term Preterm AB Living  3 2     1 2   SAB TAB Ectopic Multiple Live Births               # Outcome Date GA Lbr Len/2nd Weight Sex Delivery Anes PTL Lv  3 AB           2 Para           1 Para              ROS: A ROS was performed and pertinent positives and negatives are included in the history.  GENERAL: No fevers or chills. HEENT: No change in vision, no earache, sore throat or sinus congestion. NECK: No pain or stiffness. CARDIOVASCULAR: No chest pain or pressure. No palpitations. PULMONARY: No shortness of breath, cough or wheeze. GASTROINTESTINAL: No abdominal pain, nausea, vomiting or diarrhea, melena or bright red blood per rectum. GENITOURINARY: No urinary frequency, urgency, hesitancy or dysuria. MUSCULOSKELETAL: No joint or muscle pain, no back pain, no recent trauma. DERMATOLOGIC: No rash, no itching, no lesions. ENDOCRINE: No polyuria, polydipsia, no heat  or cold intolerance. No recent change in weight. HEMATOLOGICAL: No anemia or easy bruising or bleeding. NEUROLOGIC: No headache, seizures, numbness, tingling or weakness. PSYCHIATRIC: No depression, no loss of interest in normal activity or change in sleep pattern.     Exam:   BP 108/70   Ht 5' 3.25" (1.607 m)   Wt 180 lb (81.6 kg)   BMI 31.63 kg/m   Body mass index is 31.63 kg/m.  General appearance : Well developed well nourished female. No acute distress HEENT: Eyes: no retinal hemorrhage or exudates,  Neck supple, trachea midline, no carotid bruits, no thyroidmegaly Lungs: Clear to auscultation, no rhonchi or wheezes, or rib retractions  Heart: Regular rate and rhythm, no murmurs or gallops Breast:Examined in sitting and supine position were symmetrical in appearance, no palpable masses or tenderness,  no skin retraction, no nipple inversion, no nipple discharge, no skin discoloration, no axillary or supraclavicular lymphadenopathy Abdomen: no palpable masses or tenderness, no rebound or guarding Extremities: no edema or skin discoloration or tenderness  Pelvic: Vulva: Normal             Vagina: No gross lesions.  Increased discharge.  Wet prep done.  Pap reflex/Gono-Chlam.  Cervix/Uterus absent  Adnexa  Without masses or tenderness  Anus: Normal  Wet prep negative   Assessment/Plan:  58 y.o. female for  annual exam   1. Encounter for Papanicolaou smear of vagina as part of routine gynecological examination Gynecologic exam status post total hysterectomy in menopause.  Pap reflex done on the vaginal vault because of history of severe dysplasia..  Breast exam normal.  Patient will schedule a screening mammogram now.  Health labs with family physician.  Colonoscopy in 2015.  2. S/P total hysterectomy  3. Menopause syndrome Status post total hysterectomy.  Very symptomatic menopause with vasomotor symptoms.  No contraindication to hormone replacement therapy.  Usage, risks and  benefits of hormone replacement therapy thoroughly reviewed.  Estradiol 0.05 patch once a week prescribed.  4. Severe cervical dysplasia Status post LEEP and then total hysterectomy.  5. Vaginal discharge Wet prep negative, patient reassured. - WET PREP FOR TRICH, YEAST, CLUE  6. Moderate episode of recurrent major depressive disorder (HCC) Recurrent major depressive disorder.  Major depression currently after stopping Wellbutrin XL.  No plan to commit suicide.  Decision to restart on Wellbutrin XL 150 mg daily.  Recommend increasing physical activities.  Patient will follow-up with family physician.  Other orders - buPROPion (WELLBUTRIN XL) 150 MG 24 hr tablet; TAKE 1 TABLET BY MOUTH EVERY DAY IN THE MORNING - estradiol (CLIMARA - DOSED IN MG/24 HR) 0.05 mg/24hr patch; Place 1 patch (0.05 mg total) onto the skin once a week.  Counseling on above issues and coordination of care more than 50% for 15 minutes.  Genia Del MD, 1:43 PM 03/06/2019

## 2019-03-07 ENCOUNTER — Encounter: Payer: Self-pay | Admitting: Obstetrics & Gynecology

## 2019-03-07 NOTE — Patient Instructions (Signed)
1. Encounter for Papanicolaou smear of vagina as part of routine gynecological examination Gynecologic exam status post total hysterectomy in menopause.  Pap reflex done on the vaginal vault because of history of severe dysplasia..  Breast exam normal.  Patient will schedule a screening mammogram now.  Health labs with family physician.  Colonoscopy in 2015.  2. S/P total hysterectomy  3. Menopause syndrome Status post total hysterectomy.  Very symptomatic menopause with vasomotor symptoms.  No contraindication to hormone replacement therapy.  Usage, risks and benefits of hormone replacement therapy thoroughly reviewed.  Estradiol 0.05 patch once a week prescribed.  4. Severe cervical dysplasia Status post LEEP and then total hysterectomy.  5. Vaginal discharge Wet prep negative, patient reassured. - WET PREP FOR TRICH, YEAST, CLUE  6. Moderate episode of recurrent major depressive disorder (HCC) Recurrent major depressive disorder.  Major depression currently after stopping Wellbutrin XL.  No plan to commit suicide.  Decision to restart on Wellbutrin XL 150 mg daily.  Recommend increasing physical activities.  Patient will follow-up with family physician.  Other orders - buPROPion (WELLBUTRIN XL) 150 MG 24 hr tablet; TAKE 1 TABLET BY MOUTH EVERY DAY IN THE MORNING - estradiol (CLIMARA - DOSED IN MG/24 HR) 0.05 mg/24hr patch; Place 1 patch (0.05 mg total) onto the skin once a week.  Brittany Neal, it was a pleasure seeing you today!  I will inform you of your results as soon as they are available.

## 2019-03-08 ENCOUNTER — Encounter: Payer: Self-pay | Admitting: *Deleted

## 2019-03-08 LAB — PAP IG, CT-NG NAA, HPV HIGH-RISK
C. trachomatis RNA, TMA: NOT DETECTED
HPV DNA High Risk: NOT DETECTED
N. gonorrhoeae RNA, TMA: NOT DETECTED

## 2019-06-26 ENCOUNTER — Other Ambulatory Visit: Payer: Self-pay | Admitting: *Deleted

## 2019-06-26 MED ORDER — GABAPENTIN 300 MG PO CAPS: ORAL_CAPSULE | ORAL | 3 refills | Status: DC

## 2019-08-03 ENCOUNTER — Ambulatory Visit: Payer: Managed Care, Other (non HMO) | Admitting: Sports Medicine

## 2019-08-08 ENCOUNTER — Other Ambulatory Visit: Payer: Self-pay

## 2019-08-08 ENCOUNTER — Ambulatory Visit (INDEPENDENT_AMBULATORY_CARE_PROVIDER_SITE_OTHER): Payer: Managed Care, Other (non HMO)

## 2019-08-08 ENCOUNTER — Ambulatory Visit (INDEPENDENT_AMBULATORY_CARE_PROVIDER_SITE_OTHER): Payer: Managed Care, Other (non HMO) | Admitting: Sports Medicine

## 2019-08-08 ENCOUNTER — Encounter: Payer: Self-pay | Admitting: Sports Medicine

## 2019-08-08 VITALS — BP 117/77 | HR 82 | Ht 63.25 in | Wt 186.0 lb

## 2019-08-08 DIAGNOSIS — Z0000 Encounter for general adult medical examination without abnormal findings: Secondary | ICD-10-CM | POA: Diagnosis not present

## 2019-08-08 DIAGNOSIS — M25532 Pain in left wrist: Secondary | ICD-10-CM

## 2019-08-08 DIAGNOSIS — Z1231 Encounter for screening mammogram for malignant neoplasm of breast: Secondary | ICD-10-CM

## 2019-08-08 MED ORDER — NAPROXEN 500 MG PO TABS: 500.0000 mg | ORAL_TABLET | Freq: Two times a day (BID) | ORAL | 3 refills | Status: DC

## 2019-08-08 MED ORDER — BUPROPION HCL ER (XL) 150 MG PO TB24: ORAL_TABLET | ORAL | 3 refills | Status: DC

## 2019-08-08 NOTE — Progress Notes (Signed)
Subjective:    CC: Left wrist pain  HPI: For 1 to 2 months this pleasant 58 year old female has had pain in her left wrist, localized just distal to the ulnar styloid, worse with holding her cell phone, pain comes randomly, no trauma, no mechanical symptoms recently.  Localized with radiation up to the elbow occasionally.  Preventive measures: Due for screening mammography, routine labs.  I reviewed the past medical history, family history, social history, surgical history, and allergies today and no changes were needed.  Please see the problem list section below in epic for further details.  Past Medical History: Past Medical History:  Diagnosis Date  . ADHD (attention deficit hyperactivity disorder)   . Anxiety    stress related  . CIN II (cervical intraepithelial neoplasia II)   . Depression   . Headache(784.0)    migraines  . PONV (postoperative nausea and vomiting)   . Vitamin D deficiency    Past Surgical History: Past Surgical History:  Procedure Laterality Date  . ABDOMINAL HYSTERECTOMY  2007  . COLONOSCOPY N/A 10/15/2014   Procedure: COLONOSCOPY;  Surgeon: Romie Levee, MD;  Location: [facility];  Service: Endoscopy;  Laterality: N/A;  . DILATION AND CURETTAGE OF UTERUS  2007  . KNEE SURGERY Left 2000  . LAPAROSCOPIC GASTRIC SLEEVE RESECTION N/A 06/03/2014   Procedure: LAPAROSCOPIC GASTRIC SLEEVE RESECTION WITH HIATAL HERNIA  REPAIR;  Surgeon: Atilano Ina, MD;  Location: [facility];  Service: General;  Laterality: N/A;   Social History: Social History   Socioeconomic History  . Marital status: Divorced    Spouse name: Not on file  . Number of children: Not on file  . Years of education: Not on file  . Highest education level: Not on file  Occupational History  . Not on file  Social Needs  . Financial resource strain: Not on file  . Food insecurity    Worry: Not on file    Inability: Not on file  . Transportation needs    Medical: Not on file   Non-medical: Not on file  Tobacco Use  . Smoking status: Never Smoker  . Smokeless tobacco: Never Used  Substance and Sexual Activity  . Alcohol use: Yes    Alcohol/week: 1.0 standard drinks    Types: 1 Glasses of wine per week  . Drug use: No  . Sexual activity: Yes    Birth control/protection: Condom    Comment: intercourse age 49, more than sexual partners,des unknown  Lifestyle  . Physical activity    Days per week: Not on file    Minutes per session: Not on file  . Stress: Not on file  Relationships  . Social Musician on phone: Not on file    Gets together: Not on file    Attends religious service: Not on file    Active member of club or organization: Not on file    Attends meetings of clubs or organizations: Not on file    Relationship status: Not on file  Other Topics Concern  . Not on file  Social History Narrative  . Not on file   Family History: Family History  Problem Relation Age of Onset  . Arthritis Mother   . Heart disease Sister        MV  . Stroke Other   . Obesity Other   . Congestive Heart Failure Daughter   . Heart failure Daughter   . Colon cancer Neg Hx    Allergies: Allergies  Allergen Reactions  . Percocet [Oxycodone-Acetaminophen] Itching   Medications: See med rec.  Review of Systems: No fevers, chills, night sweats, weight loss, chest pain, or shortness of breath.   Objective:    General: Well Developed, well nourished, and in no acute distress.  Neuro: Alert and oriented x3, extra-ocular muscles intact, sensation grossly intact.  HEENT: Normocephalic, atraumatic, pupils equal round reactive to light, neck supple, no masses, no lymphadenopathy, thyroid nonpalpable.  Skin: Warm and dry, no rashes. Cardiac: Regular rate and rhythm, no murmurs rubs or gallops, no lower extremity edema.  Respiratory: Clear to auscultation bilaterally. Not using accessory muscles, speaking in full sentences. Left wrist: Inspection normal  with no visible erythema or swelling. ROM smooth and normal with good flexion and extension and ulnar/radial deviation that is symmetrical with opposite wrist. Palpation is normal over metacarpals, navicular, lunate, and TFCC; tendons without tenderness/ swelling No snuffbox tenderness. No tenderness over Canal of Guyon. Strength 5/5 in all directions without pain. Negative tinel's and phalens signs. Negative Finkelstein sign. Negative Watson's test.  Impression and Recommendations:    Left wrist pain Present now for 1 to 2 months, adding prescription strength naproxen, x-rays, home rehab exercises, return in maybe 3 weeks and we can reevaluate with either an injection or an MRI. Not really sure what this is but I think may be radiocarpal degenerative changes.  Annual physical exam Due for routine labs, mammogram.   ___________________________________________ Gwen Her. Dianah Field, M.D., ABFM., CAQSM. Primary Care and Sports Medicine [facility]  Adjunct Professor of [employer]

## 2019-08-08 NOTE — Assessment & Plan Note (Signed)
Present now for 1 to 2 months, adding prescription strength naproxen, x-rays, home rehab exercises, return in maybe 3 weeks and we can reevaluate with either an injection or an MRI. Not really sure what this is but I think may be radiocarpal degenerative changes.

## 2019-08-08 NOTE — Assessment & Plan Note (Signed)
Due for routine labs, mammogram.

## 2019-08-09 LAB — CBC
HCT: 40.9 % (ref 35.0–45.0)
Hemoglobin: 13.5 g/dL (ref 11.7–15.5)
MCH: 28.2 pg (ref 27.0–33.0)
MCHC: 33 g/dL (ref 32.0–36.0)
MCV: 85.6 fL (ref 80.0–100.0)
MPV: 11.1 fL (ref 7.5–12.5)
Platelets: 250 10*3/uL (ref 140–400)
RBC: 4.78 10*6/uL (ref 3.80–5.10)
RDW: 12.5 % (ref 11.0–15.0)
WBC: 4.9 10*3/uL (ref 3.8–10.8)

## 2019-08-09 LAB — COMPLETE METABOLIC PANEL WITH GFR
AG Ratio: 1.4 (calc) (ref 1.0–2.5)
ALT: 10 U/L (ref 6–29)
AST: 14 U/L (ref 10–35)
Albumin: 3.9 g/dL (ref 3.6–5.1)
Alkaline phosphatase (APISO): 60 U/L (ref 37–153)
BUN: 13 mg/dL (ref 7–25)
CO2: 26 mmol/L (ref 20–32)
Calcium: 9.1 mg/dL (ref 8.6–10.4)
Chloride: 108 mmol/L (ref 98–110)
Creat: 0.74 mg/dL (ref 0.50–1.05)
GFR, Est African American: 104 mL/min/{1.73_m2} (ref >=60)
GFR, Est Non African American: 90 mL/min/{1.73_m2} (ref >=60)
Globulin: 2.7 g/dL (calc) (ref 1.9–3.7)
Glucose, Bld: 84 mg/dL (ref 65–99)
Potassium: 4.1 mmol/L (ref 3.5–5.3)
Sodium: 141 mmol/L (ref 135–146)
Total Bilirubin: 0.8 mg/dL (ref 0.2–1.2)
Total Protein: 6.6 g/dL (ref 6.1–8.1)

## 2019-08-09 LAB — TSH: TSH: 2.85 mIU/L (ref 0.40–4.50)

## 2019-08-09 LAB — LIPID PANEL W/REFLEX DIRECT LDL
Cholesterol: 184 mg/dL (ref <200)
HDL: 64 mg/dL (ref >=50)
LDL Cholesterol (Calc): 101 mg/dL (calc) — ABNORMAL HIGH
Non-HDL Cholesterol (Calc): 120 mg/dL (calc) (ref <130)
Total CHOL/HDL Ratio: 2.9 (calc) (ref <5.0)
Triglycerides: 93 mg/dL (ref <150)

## 2019-08-09 LAB — VITAMIN D 25 HYDROXY (VIT D DEFICIENCY, FRACTURES): Vit D, 25-Hydroxy: 21 ng/mL — ABNORMAL LOW (ref 30–100)

## 2019-08-09 MED ORDER — VITAMIN D (ERGOCALCIFEROL) 1.25 MG (50000 UNIT) PO CAPS: 50000.0000 [IU] | ORAL_CAPSULE | ORAL | 0 refills | Status: DC

## 2019-08-09 NOTE — Addendum Note (Signed)
Addended by: Silverio Decamp on: 08/09/2019 11:01 AM   Modules accepted: Orders

## 2019-08-29 ENCOUNTER — Ambulatory Visit: Payer: Managed Care, Other (non HMO) | Admitting: Sports Medicine

## 2020-03-06 ENCOUNTER — Encounter: Payer: Managed Care, Other (non HMO) | Admitting: Obstetrics & Gynecology

## 2020-03-27 ENCOUNTER — Other Ambulatory Visit: Payer: Self-pay | Admitting: Obstetrics & Gynecology

## 2020-03-27 NOTE — Telephone Encounter (Signed)
Annual exam scheduled on 05/19/20

## 2020-04-18 ENCOUNTER — Ambulatory Visit (INDEPENDENT_AMBULATORY_CARE_PROVIDER_SITE_OTHER): Payer: 59 | Admitting: Medical-Surgical

## 2020-04-18 ENCOUNTER — Other Ambulatory Visit: Payer: Self-pay

## 2020-04-18 ENCOUNTER — Encounter: Payer: Self-pay | Admitting: Medical-Surgical

## 2020-04-18 VITALS — BP 112/70 | HR 77 | Temp 98.1°F | Ht 63.25 in | Wt 204.5 lb

## 2020-04-18 DIAGNOSIS — S8011XA Contusion of right lower leg, initial encounter: Secondary | ICD-10-CM | POA: Diagnosis not present

## 2020-04-18 DIAGNOSIS — R2241 Localized swelling, mass and lump, right lower limb: Secondary | ICD-10-CM

## 2020-04-18 NOTE — Progress Notes (Signed)
Subjective:    CC: Right lower extremity swelling and bruising  HPI: Pleasant 59 year old female presenting today with reports of a fall 2 weeks ago while helping care for her friend in [place]. [place], hitting her right lower extremity along a rail. She did not seek medical attention at that time and has had no difficulty walking. Today she reports she has continued to have significant swelling and bruising and several of her friends have encouraged her to come to the doctor for concerns of a blood clot. She has had no redness, heat, fever, chills, chest pain, shortness of breath, severe headaches, numbness/tingling to the leg/foot. She has been elevating her right lower extremity at night but admits that it is down most of the day. Notes that overall her swelling and bruising have improved but she wants to make sure that she does not have a blood clot.  I reviewed the past medical history, family history, social history, surgical history, and allergies today and no changes were needed.  Please see the problem list section below in epic for further details.  Past Medical History: Past Medical History:  Diagnosis Date  . ADHD (attention deficit hyperactivity disorder)   . Anxiety    stress related  . CIN II (cervical intraepithelial neoplasia II)   . Depression   . Headache(784.0)    migraines  . PONV (postoperative nausea and vomiting)   . Vitamin D deficiency    Past Surgical History: Past Surgical History:  Procedure Laterality Date  . ABDOMINAL HYSTERECTOMY  2007  . COLONOSCOPY N/A 10/15/2014   Procedure: COLONOSCOPY;  Surgeon: Romie Levee, MD;  Location: [facility];  Service: Endoscopy;  Laterality: N/A;  . DILATION AND CURETTAGE OF UTERUS  2007  . KNEE SURGERY Left 2000  . LAPAROSCOPIC GASTRIC SLEEVE RESECTION N/A 06/03/2014   Procedure: LAPAROSCOPIC GASTRIC SLEEVE RESECTION WITH HIATAL HERNIA  REPAIR;  Surgeon: Atilano Ina,  MD;  Location: [facility];  Service: General;  Laterality: N/A;   Social History: Social History   Socioeconomic History  . Marital status: Divorced    Spouse name: Not on file  . Number of children: Not on file  . Years of education: Not on file  . Highest education level: Not on file  Occupational History  . Not on file  Tobacco Use  . Smoking status: Never Smoker  . Smokeless tobacco: Never Used  Vaping Use  . Vaping Use: Never used  Substance and Sexual Activity  . Alcohol use: Yes    Alcohol/week: 1.0 standard drink    Types: 1 Glasses of wine per week  . Drug use: No  . Sexual activity: Yes    Birth control/protection: Condom    Comment: intercourse age 74, more than sexual partners,des unknown  Other Topics Concern  . Not on file  Social History Narrative  . Not on file   Social Determinants of Health   Financial Resource Strain:   . Difficulty of Paying Living Expenses:   Food Insecurity:   . Worried About Programme researcher, broadcasting/film/video in the Last Year:   . Barista in the Last Year:   Transportation Needs:   . Freight forwarder (Medical):   Marland Kitchen Lack of Transportation (Non-Medical):   Physical Activity:   . Days of Exercise per Week:   . Minutes of Exercise per Session:   Stress:   . Feeling of Stress :   Social Connections:   .  Frequency of Communication with Friends and Family:   . Frequency of Social Gatherings with Friends and Family:   . Attends Religious Services:   . Active Member of Clubs or Organizations:   . Attends Banker Meetings:   Marland Kitchen Marital Status:    Family History: Family History  Problem Relation Age of Onset  . Arthritis Mother   . Heart disease Sister        MV  . Stroke Other   . Obesity Other   . Congestive Heart Failure Daughter   . Heart failure Daughter   . Colon cancer Neg Hx    Allergies: Allergies  Allergen Reactions  . Percocet [Oxycodone-Acetaminophen] Itching   Medications: See med rec.  Review of  Systems: No fevers, chills, night sweats, weight loss, chest pain, or shortness of breath.   Objective:    General: Well Developed, well nourished, and in no acute distress.  Neuro: Alert and oriented x3.  HEENT: Normocephalic, atraumatic.  Skin: Warm and dry. See clinical photo below. Cardiac: Regular rate and rhythm, no murmurs rubs or gallops. Mild nonpitting edema to the lower extremity from knee to ankle and dorsal right foot. 2+ dorsalis pedis and posterior tibialis pulses. Respiratory: Clear to auscultation bilaterally. Not using accessory muscles, speaking in full sentences. MSK: Full range of motion to bilateral lower extremities. Tenderness over the area of ecchymosis but no excessive warmth or tenderness to the calf. Bilateral calf measurement 10 inches. Left midpoint calf measurement 18 inches, right midpoint calf measurement 18.5 inches.    Impression and Recommendations:    1. Localized swelling of right lower extremity/ecchymosis Low suspicion for DVT. Wells score 0. Continue monitoring for excessive warmth or development of pain in the right lower extremity. Recommend compression stocking for treatment of swelling. Discussed expectation for resolution of ecchymosis. Given that which she has seen improvement from the original trauma, suspect this will heal with no complications. May use Tylenol/ibuprofen for discomfort.  Return if symptoms worsen or fail to improve. ___________________________________________ Thayer Ohm, DNP, APRN, FNP-BC Primary Care and Sports Medicine [facility]

## 2020-05-19 ENCOUNTER — Other Ambulatory Visit: Payer: Self-pay

## 2020-05-19 ENCOUNTER — Encounter: Payer: Self-pay | Admitting: Obstetrics & Gynecology

## 2020-05-19 ENCOUNTER — Ambulatory Visit (INDEPENDENT_AMBULATORY_CARE_PROVIDER_SITE_OTHER): Payer: 59 | Admitting: Obstetrics & Gynecology

## 2020-05-19 VITALS — Ht 63.0 in | Wt 200.0 lb

## 2020-05-19 DIAGNOSIS — M8588 Other specified disorders of bone density and structure, other site: Secondary | ICD-10-CM

## 2020-05-19 DIAGNOSIS — Z6835 Body mass index (BMI) 35.0-35.9, adult: Secondary | ICD-10-CM

## 2020-05-19 DIAGNOSIS — Z01419 Encounter for gynecological examination (general) (routine) without abnormal findings: Secondary | ICD-10-CM | POA: Diagnosis not present

## 2020-05-19 DIAGNOSIS — Z1272 Encounter for screening for malignant neoplasm of vagina: Secondary | ICD-10-CM

## 2020-05-19 DIAGNOSIS — Z90710 Acquired absence of both cervix and uterus: Secondary | ICD-10-CM | POA: Diagnosis not present

## 2020-05-19 DIAGNOSIS — E6609 Other obesity due to excess calories: Secondary | ICD-10-CM

## 2020-05-19 DIAGNOSIS — Z780 Asymptomatic menopausal state: Secondary | ICD-10-CM

## 2020-05-19 NOTE — Progress Notes (Addendum)
Brittany Neal Apr 12, 1961 [id number]   History:    59 y.o. G3P2A1L2 Boyfriend.  73 yo daughter, 26 yo daughter.  RP: Established patient presenting for annual gyn exam   HPI: S/P Total Hysterectomy.  Had a LEEP for CIN 3 in 2003.  Menopause, well on Climara patch.  Followed by Psychotherapist, walking therapy.  No pain with IC.  No pelvic pain.  Breasts normal.  BMI increased to 35.43.  Health labs with Fam MD.   Past medical history,surgical history, family history and social history were all reviewed and documented in the EPIC chart.  Gynecologic History No LMP recorded. Patient has had a hysterectomy.  Obstetric History OB History  Gravida Para Term Preterm AB Living  3 2     1 2   SAB TAB Ectopic Multiple Live Births               # Outcome Date GA Lbr Len/2nd Weight Sex Delivery Anes PTL Lv  3 AB           2 Para           1 Para              ROS: A ROS was performed and pertinent positives and negatives are included in the history.  GENERAL: No fevers or chills. HEENT: No change in vision, no earache, sore throat or sinus congestion. NECK: No pain or stiffness. CARDIOVASCULAR: No chest pain or pressure. No palpitations. PULMONARY: No shortness of breath, cough or wheeze. GASTROINTESTINAL: No abdominal pain, nausea, vomiting or diarrhea, melena or bright red blood per rectum. GENITOURINARY: No urinary frequency, urgency, hesitancy or dysuria. MUSCULOSKELETAL: No joint or muscle pain, no back pain, no recent trauma. DERMATOLOGIC: No rash, no itching, no lesions. ENDOCRINE: No polyuria, polydipsia, no heat or cold intolerance. No recent change in weight. HEMATOLOGICAL: No anemia or easy bruising or bleeding. NEUROLOGIC: No headache, seizures, numbness, tingling or weakness. PSYCHIATRIC: No depression, no loss of interest in normal activity or change in sleep pattern.     Exam:   Ht 5\' 3"  (1.6 m)   Wt 200 lb (90.7 kg)   BMI 35.43 kg/m   Body mass index is 35.43  kg/m.  General appearance : Well developed well nourished female. No acute distress HEENT: Eyes: no retinal hemorrhage or exudates,  Neck supple, trachea midline, no carotid bruits, no thyroidmegaly Lungs: Clear to auscultation, no rhonchi or wheezes, or rib retractions  Heart: Regular rate and rhythm, no murmurs or gallops Breast:Examined in sitting and supine position were symmetrical in appearance, no palpable masses or tenderness,  no skin retraction, no nipple inversion, no nipple discharge, no skin discoloration, no axillary or supraclavicular lymphadenopathy Abdomen: no palpable masses or tenderness, no rebound or guarding Extremities: no edema or skin discoloration or tenderness  Pelvic: Vulva: Normal             Vagina: No gross lesions or discharge.  Pap reflex done.  Cervix/Uterus absent  Adnexa  Without masses or tenderness  Anus: Normal   Assessment/Plan:  59 y.o. female for annual exam   1. Encounter for Papanicolaou smear of vagina as part of routine gynecological examination Gynecologic exam status post total hysterectomy.  Pap reflex done on the vaginal vault due to history of CIN-3.  Breast exam normal.  Needs to schedule a screening mammogram now.  Colonoscopy in 2015.  Health labs with family physician.  2. S/P total hysterectomy  3. Postmenopause Well on Climara hormone  replacement therapy.  No CI to continue.  4. Osteopenia of lumbar spine Mild osteopenia with a T score of -1.4 at the spine in 2018.  Will repeat a bone density now.  Vitamin D supplements, calcium intake of 1200 to 1500 mg daily and regular weightbearing physical activities. - DG Bone Density; Future  5. Class 2 obesity due to excess calories without serious comorbidity with body mass index (BMI) of 35.0 to 35.9 in adult  Referred to [facility].  Intermittent fasting recommended.  Increase physical activities with aerobic activities 5 times a week and light weightlifting every 2  days.  Genia Del MD, 4:24 PM 05/19/2020

## 2020-05-21 ENCOUNTER — Telehealth: Payer: Self-pay | Admitting: *Deleted

## 2020-05-21 DIAGNOSIS — E6609 Other obesity due to excess calories: Secondary | ICD-10-CM

## 2020-05-21 NOTE — Telephone Encounter (Signed)
-----   Message from Genia Del, MD sent at 05/19/2020  4:47 PM EDT ----- Regarding: Refer to [facility] Obesity, Nutrition plan.

## 2020-05-21 NOTE — Telephone Encounter (Signed)
Referral placed at [facility] they will call to schedule. 

## 2020-05-23 ENCOUNTER — Encounter: Payer: Self-pay | Admitting: Obstetrics & Gynecology

## 2020-05-23 LAB — PAP IG W/ RFLX HPV ASCU

## 2020-05-23 LAB — HUMAN PAPILLOMAVIRUS, HIGH RISK: HPV DNA High Risk: NOT DETECTED

## 2020-06-02 NOTE — Telephone Encounter (Signed)
Patient told Nutrition she is going to check with insurance before scheduling. Encounter will be closed.

## 2020-06-10 ENCOUNTER — Other Ambulatory Visit: Payer: Self-pay

## 2020-06-10 ENCOUNTER — Other Ambulatory Visit: Payer: Self-pay | Admitting: Obstetrics & Gynecology

## 2020-06-10 ENCOUNTER — Ambulatory Visit (INDEPENDENT_AMBULATORY_CARE_PROVIDER_SITE_OTHER): Payer: 59

## 2020-06-10 DIAGNOSIS — Z780 Asymptomatic menopausal state: Secondary | ICD-10-CM

## 2020-06-10 DIAGNOSIS — M8588 Other specified disorders of bone density and structure, other site: Secondary | ICD-10-CM

## 2020-06-10 NOTE — Telephone Encounter (Signed)
Per DPR access note on file I left detailed message in voice mail. Recall is on file.

## 2020-08-19 ENCOUNTER — Encounter: Payer: Self-pay | Admitting: Sports Medicine

## 2020-08-19 ENCOUNTER — Ambulatory Visit (INDEPENDENT_AMBULATORY_CARE_PROVIDER_SITE_OTHER): Payer: 59 | Admitting: Sports Medicine

## 2020-08-19 ENCOUNTER — Other Ambulatory Visit: Payer: Self-pay

## 2020-08-19 VITALS — BP 107/70 | HR 75 | Ht 63.0 in | Wt 201.0 lb

## 2020-08-19 DIAGNOSIS — L989 Disorder of the skin and subcutaneous tissue, unspecified: Secondary | ICD-10-CM | POA: Diagnosis not present

## 2020-08-19 DIAGNOSIS — Z0000 Encounter for general adult medical examination without abnormal findings: Secondary | ICD-10-CM

## 2020-08-19 DIAGNOSIS — F339 Major depressive disorder, recurrent, unspecified: Secondary | ICD-10-CM | POA: Diagnosis not present

## 2020-08-19 DIAGNOSIS — E66813 Obesity, class 3: Secondary | ICD-10-CM

## 2020-08-19 MED ORDER — BUPROPION HCL ER (XL) 150 MG PO TB24: 150.0000 mg | ORAL_TABLET | ORAL | 0 refills | Status: DC

## 2020-08-19 MED ORDER — WEGOVY 1 MG/0.5ML ~~LOC~~ SOAJ: 1.0000 mg | SUBCUTANEOUS | 0 refills | Status: DC

## 2020-08-19 MED ORDER — WEGOVY 0.5 MG/0.5ML ~~LOC~~ SOAJ: 0.5000 mg | SUBCUTANEOUS | 0 refills | Status: DC

## 2020-08-19 MED ORDER — WEGOVY 0.25 MG/0.5ML ~~LOC~~ SOAJ: 0.2500 mg | SUBCUTANEOUS | 0 refills | Status: DC

## 2020-08-19 MED ORDER — VORTIOXETINE HBR 10 MG PO TABS: 10.0000 mg | ORAL_TABLET | Freq: Every day | ORAL | 3 refills | Status: DC

## 2020-08-19 NOTE — Progress Notes (Signed)
Subjective:    CC: Annual Physical Exam  HPI:  This patient is here for their annual physical  I reviewed the past medical history, family history, social history, surgical history, and allergies today and no changes were needed.  Please see the problem list section below in epic for further details.  Past Medical History: Past Medical History:  Diagnosis Date  . ADHD (attention deficit hyperactivity disorder)   . Anxiety    stress related  . CIN II (cervical intraepithelial neoplasia II)   . Depression   . Headache(784.0)    migraines  . PONV (postoperative nausea and vomiting)   . Vitamin D deficiency    Past Surgical History: Past Surgical History:  Procedure Laterality Date  . ABDOMINAL HYSTERECTOMY  2007  . COLONOSCOPY N/A 10/15/2014   Procedure: COLONOSCOPY;  Surgeon: Romie Levee, MD;  Location: [facility];  Service: Endoscopy;  Laterality: N/A;  . DILATION AND CURETTAGE OF UTERUS  2007  . KNEE SURGERY Left 2000  . LAPAROSCOPIC GASTRIC SLEEVE RESECTION N/A 06/03/2014   Procedure: LAPAROSCOPIC GASTRIC SLEEVE RESECTION WITH HIATAL HERNIA  REPAIR;  Surgeon: Atilano Ina, MD;  Location: [facility];  Service: General;  Laterality: N/A;   Social History: Social History   Socioeconomic History  . Marital status: Divorced    Spouse name: Not on file  . Number of children: Not on file  . Years of education: Not on file  . Highest education level: Not on file  Occupational History  . Not on file  Tobacco Use  . Smoking status: Never Smoker  . Smokeless tobacco: Never Used  Vaping Use  . Vaping Use: Never used  Substance and Sexual Activity  . Alcohol use: Yes    Alcohol/week: 1.0 standard drink    Types: 1 Glasses of wine per week  . Drug use: No  . Sexual activity: Yes    Partners: Male    Birth control/protection: Condom    Comment: intercourse age 77, more than sexual partners,des unknown  Other Topics Concern  . Not on file  Social History Narrative  .  Not on file   Social Determinants of Health   Financial Resource Strain:   . Difficulty of Paying Living Expenses: Not on file  Food Insecurity:   . Worried About Programme researcher, broadcasting/film/video in the Last Year: Not on file  . Ran Out of Food in the Last Year: Not on file  Transportation Needs:   . Lack of Transportation (Medical): Not on file  . Lack of Transportation (Non-Medical): Not on file  Physical Activity:   . Days of Exercise per Week: Not on file  . Minutes of Exercise per Session: Not on file  Stress:   . Feeling of Stress : Not on file  Social Connections:   . Frequency of Communication with Friends and Family: Not on file  . Frequency of Social Gatherings with Friends and Family: Not on file  . Attends Religious Services: Not on file  . Active Member of Clubs or Organizations: Not on file  . Attends Banker Meetings: Not on file  . Marital Status: Not on file   Family History: Family History  Problem Relation Age of Onset  . Arthritis Mother   . Heart disease Sister        MV  . Stroke Other   . Obesity Other   . Congestive Heart Failure Daughter   . Heart failure Daughter   . Colon cancer Neg Hx  Allergies: Allergies  Allergen Reactions  . Percocet [Oxycodone-Acetaminophen] Itching   Medications: See med rec.  Review of Systems: No headache, visual changes, nausea, vomiting, diarrhea, constipation, dizziness, abdominal pain, skin rash, fevers, chills, night sweats, swollen lymph nodes, weight loss, chest pain, body aches, joint swelling, muscle aches, shortness of breath, mood changes, visual or auditory hallucinations.  Objective:    General: Well Developed, well nourished, and in no acute distress.  Neuro: Alert and oriented x3, extra-ocular muscles intact, sensation grossly intact. Cranial nerves II through XII are intact, motor, sensory, and coordinative functions are all intact. HEENT: Normocephalic, atraumatic, pupils equal round reactive to  light, neck supple, no masses, no lymphadenopathy, thyroid nonpalpable. Oropharynx, nasopharynx, external ear canals are unremarkable. Skin: Warm and dry, no rashes noted.  Cardiac: Regular rate and rhythm, no murmurs rubs or gallops.  Respiratory: Clear to auscultation bilaterally. Not using accessory muscles, speaking in full sentences.  Abdominal: Soft, nontender, nondistended, positive bowel sounds, no masses, no organomegaly.  Musculoskeletal: Shoulder, elbow, wrist, hip, knee, ankle stable, and with full range of motion.  Procedure:  Cryodestruction of seborrheic keratosis right upper chest, skin tag back of neck Consent obtained and verified. Time-out conducted. Noted no overlying erythema, induration, or other signs of local infection. Completed without difficulty using Cryo-Gun. Advised to call if fevers/chills, erythema, induration, drainage, or persistent bleeding.  Impression and Recommendations:    The patient was counselled, risk factors were discussed, anticipatory guidance given.  Annual physical exam Annual physical as above, up-to-date on screening measures, ordering routine labs.  Depression Currently dealing with major depression, has not been on any psychotropics, we will start Wellbutrin, and again try to get her approved for Trintellix, she is resistant to any other SSRI that potentially could be weight positive. Risks explained. If she does not get Trintellix we can certainly try Viibryd. Return in 6 weeks for this.  For prior authorization purposes she has tried and failed multiple other antidepressants including Wellbutrin, Lexapro, Celexa, Zoloft.  Obesity, Class III, BMI 40-49.9 (morbid obesity) Starting [id number].  Skin lesion Cryotherapy of seborrheic keratosis on the right upper chest and a skin tag on the back of the neck.   ___________________________________________ Ihor Austin. Benjamin Stain, M.D., ABFM., CAQSM. Primary Care and Sports Medicine [facility]  Adjunct Professor of Family Medicine  [employer]

## 2020-08-19 NOTE — Assessment & Plan Note (Addendum)
Currently dealing with major depression, has not been on any psychotropics, we will start Wellbutrin, and again try to get her approved for Trintellix, she is resistant to any other SSRI that potentially could be weight positive. Risks explained. If she does not get Trintellix we can certainly try Viibryd. Return in 6 weeks for this.  For prior authorization purposes she has tried and failed multiple other antidepressants including Wellbutrin, Lexapro, Celexa, Zoloft.

## 2020-08-19 NOTE — Assessment & Plan Note (Signed)
Starting DUKGUR.

## 2020-08-19 NOTE — Assessment & Plan Note (Signed)
Cryotherapy of seborrheic keratosis on the right upper chest and a skin tag on the back of the neck.

## 2020-08-19 NOTE — Assessment & Plan Note (Signed)
Annual physical as above, up-to-date on screening measures, ordering routine labs. 

## 2020-08-20 ENCOUNTER — Telehealth: Payer: Self-pay

## 2020-08-20 LAB — COMPREHENSIVE METABOLIC PANEL
AG Ratio: 1.8 (calc) (ref 1.0–2.5)
ALT: 9 U/L (ref 6–29)
AST: 14 U/L (ref 10–35)
Albumin: 4.2 g/dL (ref 3.6–5.1)
Alkaline phosphatase (APISO): 70 U/L (ref 37–153)
BUN: 12 mg/dL (ref 7–25)
CO2: 26 mmol/L (ref 20–32)
Calcium: 9 mg/dL (ref 8.6–10.4)
Chloride: 106 mmol/L (ref 98–110)
Creat: 0.73 mg/dL (ref 0.50–1.05)
Globulin: 2.4 g/dL (calc) (ref 1.9–3.7)
Glucose, Bld: 84 mg/dL (ref 65–99)
Potassium: 4.3 mmol/L (ref 3.5–5.3)
Sodium: 140 mmol/L (ref 135–146)
Total Bilirubin: 0.9 mg/dL (ref 0.2–1.2)
Total Protein: 6.6 g/dL (ref 6.1–8.1)

## 2020-08-20 LAB — CBC
HCT: 40.9 % (ref 35.0–45.0)
Hemoglobin: 13.9 g/dL (ref 11.7–15.5)
MCH: 28.5 pg (ref 27.0–33.0)
MCHC: 34 g/dL (ref 32.0–36.0)
MCV: 83.8 fL (ref 80.0–100.0)
MPV: 11.4 fL (ref 7.5–12.5)
Platelets: 248 10*3/uL (ref 140–400)
RBC: 4.88 10*6/uL (ref 3.80–5.10)
RDW: 12.2 % (ref 11.0–15.0)
WBC: 5.6 10*3/uL (ref 3.8–10.8)

## 2020-08-20 LAB — HEMOGLOBIN A1C
Hgb A1c MFr Bld: 5 % of total Hgb (ref <5.7)
Mean Plasma Glucose: 97 (calc)
eAG (mmol/L): 5.4 (calc)

## 2020-08-20 LAB — LIPID PANEL
Cholesterol: 181 mg/dL (ref <200)
HDL: 66 mg/dL (ref >=50)
LDL Cholesterol (Calc): 99 mg/dL (calc)
Non-HDL Cholesterol (Calc): 115 mg/dL (calc) (ref <130)
Total CHOL/HDL Ratio: 2.7 (calc) (ref <5.0)
Triglycerides: 69 mg/dL (ref <150)

## 2020-08-20 LAB — TSH: TSH: 1.64 mIU/L (ref 0.40–4.50)

## 2020-08-20 NOTE — Telephone Encounter (Signed)
PA for Trintellix submitted through CoverMyMeds.

## 2020-08-21 MED ORDER — ESTRADIOL 0.05 MG/24HR TD PTWK: 0.0500 mg | MEDICATED_PATCH | TRANSDERMAL | 4 refills | Status: AC

## 2020-08-21 NOTE — Telephone Encounter (Signed)
Ok to send Rx for the year?

## 2020-08-21 NOTE — Telephone Encounter (Signed)
Trintellix approved and was picked up by pt yesterday for $10.

## 2020-09-17 ENCOUNTER — Other Ambulatory Visit: Payer: Self-pay | Admitting: Sports Medicine

## 2020-09-30 ENCOUNTER — Ambulatory Visit (INDEPENDENT_AMBULATORY_CARE_PROVIDER_SITE_OTHER): Payer: 59 | Admitting: Sports Medicine

## 2020-09-30 ENCOUNTER — Other Ambulatory Visit: Payer: Self-pay

## 2020-09-30 DIAGNOSIS — E66813 Obesity, class 3: Secondary | ICD-10-CM

## 2020-09-30 DIAGNOSIS — F339 Major depressive disorder, recurrent, unspecified: Secondary | ICD-10-CM

## 2020-09-30 MED ORDER — VORTIOXETINE HBR 20 MG PO TABS: 20.0000 mg | ORAL_TABLET | Freq: Every day | ORAL | 11 refills | Status: DC

## 2020-09-30 MED ORDER — ONDANSETRON 8 MG PO TBDP
8.0000 mg | ORAL_TABLET | Freq: Three times a day (TID) | ORAL | 3 refills | Status: AC | PRN
Start: 2020-09-30 — End: ?

## 2020-09-30 NOTE — Assessment & Plan Note (Signed)
Major depression has improved considerably with Trintellix. Increasing to 20 mg. She is still having some difficulty focusing, increasing return TriMix will likely help, she will also try little of caffeine before work.

## 2020-09-30 NOTE — Assessment & Plan Note (Signed)
6 pound weight loss after approximately 1 month of Wegovy. I will see her back in about 6 weeks, adding some Zofran as she does get a bit of nausea for the first couple of days after each new dose.

## 2020-09-30 NOTE — Progress Notes (Signed)
    Procedures performed today:    None.  Independent interpretation of notes and tests performed by another provider:   None.  Brief History, Exam, Impression, and Recommendations:    Depression Major depression has improved considerably with Trintellix. Increasing to 20 mg. She is still having some difficulty focusing, increasing return TriMix will likely help, she will also try little of caffeine before work.   Obesity, Class III, BMI 40-49.9 (morbid obesity) 6 pound weight loss after approximately 1 month of Wegovy. I will see her back in about 6 weeks, adding some Zofran as she does get a bit of nausea for the first couple of days after each new dose.    ___________________________________________ Ihor Austin. Benjamin Stain, M.D., ABFM., CAQSM. Primary Care and Sports Medicine [facility]  Adjunct Instructor of Family Medicine  [employer]

## 2020-10-28 DIAGNOSIS — F321 Major depressive disorder, single episode, moderate: Secondary | ICD-10-CM | POA: Diagnosis not present

## 2020-11-06 DIAGNOSIS — R059 Cough, unspecified: Secondary | ICD-10-CM | POA: Diagnosis not present

## 2020-11-06 DIAGNOSIS — U071 COVID-19: Secondary | ICD-10-CM | POA: Diagnosis not present

## 2020-11-06 DIAGNOSIS — R0789 Other chest pain: Secondary | ICD-10-CM | POA: Diagnosis not present

## 2020-11-11 ENCOUNTER — Ambulatory Visit: Payer: 59 | Admitting: Sports Medicine

## 2020-12-25 DIAGNOSIS — F321 Major depressive disorder, single episode, moderate: Secondary | ICD-10-CM | POA: Diagnosis not present

## 2021-01-12 DIAGNOSIS — F321 Major depressive disorder, single episode, moderate: Secondary | ICD-10-CM | POA: Diagnosis not present

## 2021-01-14 ENCOUNTER — Other Ambulatory Visit: Payer: Self-pay

## 2021-01-14 ENCOUNTER — Other Ambulatory Visit: Payer: Self-pay | Admitting: Sports Medicine

## 2021-01-15 ENCOUNTER — Telehealth: Payer: Self-pay

## 2021-01-15 NOTE — Telephone Encounter (Signed)
Received fax from [facility] regarding a PA for Trintellix 20mg .  PA completed and submitted; awaiting response.

## 2021-03-17 ENCOUNTER — Encounter (INDEPENDENT_AMBULATORY_CARE_PROVIDER_SITE_OTHER): Payer: BC Managed Care – PPO

## 2021-03-17 DIAGNOSIS — F339 Major depressive disorder, recurrent, unspecified: Secondary | ICD-10-CM | POA: Diagnosis not present

## 2021-03-17 MED ORDER — TOPIRAMATE 50 MG PO TABS: ORAL_TABLET | ORAL | 0 refills | Status: DC

## 2021-03-17 MED ORDER — BUPROPION HCL ER (XL) 150 MG PO TB24: 150.0000 mg | ORAL_TABLET | ORAL | 0 refills | Status: DC

## 2021-03-17 NOTE — Assessment & Plan Note (Signed)
Initial improvements with Trintellix at 20, she would like to discontinue this and go back to Wellbutrin and Topamax I suspect she is looking for some weight loss improvement as well requesting this combination.

## 2021-03-17 NOTE — Telephone Encounter (Signed)
I spent 5 total minutes of online digital evaluation and management services. 

## 2021-04-16 ENCOUNTER — Other Ambulatory Visit: Payer: Self-pay | Admitting: Sports Medicine

## 2021-04-16 DIAGNOSIS — F339 Major depressive disorder, recurrent, unspecified: Secondary | ICD-10-CM

## 2021-05-02 ENCOUNTER — Other Ambulatory Visit: Payer: Self-pay | Admitting: Sports Medicine

## 2021-05-02 DIAGNOSIS — F339 Major depressive disorder, recurrent, unspecified: Secondary | ICD-10-CM

## 2021-07-10 ENCOUNTER — Ambulatory Visit: Payer: BC Managed Care – PPO | Admitting: Sports Medicine

## 2021-07-21 ENCOUNTER — Ambulatory Visit: Payer: Self-pay | Admitting: Sports Medicine

## 2021-08-05 DIAGNOSIS — S93402A Sprain of unspecified ligament of left ankle, initial encounter: Secondary | ICD-10-CM | POA: Diagnosis not present

## 2021-09-16 ENCOUNTER — Ambulatory Visit (INDEPENDENT_AMBULATORY_CARE_PROVIDER_SITE_OTHER): Payer: BC Managed Care – PPO | Admitting: Sports Medicine

## 2021-09-16 ENCOUNTER — Other Ambulatory Visit: Payer: Self-pay

## 2021-09-16 VITALS — BP 130/76 | HR 87 | Wt 218.0 lb

## 2021-09-16 DIAGNOSIS — H6121 Impacted cerumen, right ear: Secondary | ICD-10-CM

## 2021-09-16 DIAGNOSIS — I493 Ventricular premature depolarization: Secondary | ICD-10-CM | POA: Insufficient documentation

## 2021-09-16 DIAGNOSIS — Z9884 Bariatric surgery status: Secondary | ICD-10-CM

## 2021-09-16 DIAGNOSIS — Z0000 Encounter for general adult medical examination without abnormal findings: Secondary | ICD-10-CM | POA: Diagnosis not present

## 2021-09-16 DIAGNOSIS — S99912S Unspecified injury of left ankle, sequela: Secondary | ICD-10-CM

## 2021-09-16 DIAGNOSIS — S99912A Unspecified injury of left ankle, initial encounter: Secondary | ICD-10-CM | POA: Insufficient documentation

## 2021-09-16 DIAGNOSIS — Z23 Encounter for immunization: Secondary | ICD-10-CM | POA: Diagnosis not present

## 2021-09-16 DIAGNOSIS — F339 Major depressive disorder, recurrent, unspecified: Secondary | ICD-10-CM | POA: Diagnosis not present

## 2021-09-16 DIAGNOSIS — E66813 Obesity, class 3: Secondary | ICD-10-CM

## 2021-09-16 DIAGNOSIS — Z01818 Encounter for other preprocedural examination: Secondary | ICD-10-CM | POA: Diagnosis not present

## 2021-09-16 MED ORDER — DEBROX 6.5 % OT SOLN
10.0000 [drp] | Freq: Two times a day (BID) | OTIC | 0 refills | Status: DC
Start: 2021-09-16 — End: 2023-08-24

## 2021-09-16 MED ORDER — FLUOXETINE HCL 10 MG PO TABS: 10.0000 mg | ORAL_TABLET | Freq: Every day | ORAL | 3 refills | Status: DC

## 2021-09-16 NOTE — Assessment & Plan Note (Signed)
Annual physical as above, getting her caught up with flu and Shingrix vaccinations.
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
## 2021-09-16 NOTE — Addendum Note (Signed)
Addended by: Monica Becton on: 09/16/2021 11:47 AM   Modules accepted: Orders

## 2021-09-16 NOTE — Assessment & Plan Note (Addendum)
Noted impacted cerumen right ear, irrigation as above, we got a good amount of it out with aggressive irrigation, I got some more with curettage however there was still a significant amount of cerumen left in the canal obstructing the canal, adding Debrox, I will see her back in 2 weeks to recheck, we will probably be able to get the rest out with irrigation at the follow-up.
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
## 2021-09-16 NOTE — Assessment & Plan Note (Signed)
Chronic left ankle swelling and pain, she is had several inversion injuries. I would like updated x-rays, formal physical therapy, she does have an ASO that she will wear when up and about. Return to see me in 4 to 6 weeks for this.

## 2021-09-16 NOTE — Assessment & Plan Note (Addendum)
I did perform and interpret the ECG for Brittany Neal, she has an abnormal ECG, normal rate, normal rhythm, left axis deviation, occasional PVCs. On exam she does have irregularly irregular rhythm, she does have a 2/6 systolic ejection murmur, her PVCs are asymptomatic so we will get an echo to ensure structurally normal heart before deciding on simple observation.

## 2021-09-16 NOTE — Assessment & Plan Note (Addendum)
Status post laparoscopic sleeve gastrectomy, she will be having a revision coming up. Continue monthly vitamin B12. Preoperatively she will need labs and an ECG.

## 2021-09-17 LAB — COMPREHENSIVE METABOLIC PANEL
AG Ratio: 1.4 (calc) (ref 1.0–2.5)
ALT: 14 U/L (ref 6–29)
AST: 16 U/L (ref 10–35)
Albumin: 4.1 g/dL (ref 3.6–5.1)
Alkaline phosphatase (APISO): 86 U/L (ref 37–153)
BUN: 14 mg/dL (ref 7–25)
CO2: 25 mmol/L (ref 20–32)
Calcium: 9.1 mg/dL (ref 8.6–10.4)
Chloride: 106 mmol/L (ref 98–110)
Creat: 0.72 mg/dL (ref 0.50–1.03)
Globulin: 3 g/dL (calc) (ref 1.9–3.7)
Glucose, Bld: 92 mg/dL (ref 65–99)
Potassium: 4.8 mmol/L (ref 3.5–5.3)
Sodium: 142 mmol/L (ref 135–146)
Total Bilirubin: 0.5 mg/dL (ref 0.2–1.2)
Total Protein: 7.1 g/dL (ref 6.1–8.1)

## 2021-09-17 LAB — PROTIME-INR
INR: 0.9
Prothrombin Time: 9.7 s (ref 9.0–11.5)

## 2021-09-17 LAB — CBC
HCT: 43.6 % (ref 35.0–45.0)
Hemoglobin: 14.2 g/dL (ref 11.7–15.5)
MCH: 27.8 pg (ref 27.0–33.0)
MCHC: 32.6 g/dL (ref 32.0–36.0)
MCV: 85.3 fL (ref 80.0–100.0)
MPV: 11.2 fL (ref 7.5–12.5)
Platelets: 294 10*3/uL (ref 140–400)
RBC: 5.11 10*6/uL — ABNORMAL HIGH (ref 3.80–5.10)
RDW: 12.7 % (ref 11.0–15.0)
WBC: 6.6 10*3/uL (ref 3.8–10.8)

## 2021-09-17 LAB — HEMOGLOBIN A1C
Hgb A1c MFr Bld: 5.1 % of total Hgb (ref <5.7)
Mean Plasma Glucose: 100 mg/dL
eAG (mmol/L): 5.5 mmol/L

## 2021-09-17 LAB — LIPID PANEL
Cholesterol: 215 mg/dL — ABNORMAL HIGH (ref <200)
HDL: 68 mg/dL (ref >=50)
LDL Cholesterol (Calc): 126 mg/dL (calc) — ABNORMAL HIGH
Non-HDL Cholesterol (Calc): 147 mg/dL (calc) — ABNORMAL HIGH (ref <130)
Total CHOL/HDL Ratio: 3.2 (calc) (ref <5.0)
Triglycerides: 107 mg/dL (ref <150)

## 2021-09-17 LAB — TSH: TSH: 4.06 mIU/L (ref 0.40–4.50)

## 2021-09-17 LAB — APTT: aPTT: 27 s (ref 23–32)

## 2021-09-19 ENCOUNTER — Encounter: Payer: Self-pay | Admitting: Sports Medicine

## 2021-09-30 ENCOUNTER — Ambulatory Visit: Payer: BC Managed Care – PPO | Admitting: Sports Medicine

## 2021-10-05 ENCOUNTER — Other Ambulatory Visit: Payer: Self-pay | Admitting: Sports Medicine

## 2021-10-05 DIAGNOSIS — F339 Major depressive disorder, recurrent, unspecified: Secondary | ICD-10-CM

## 2021-10-21 ENCOUNTER — Ambulatory Visit: Payer: BC Managed Care – PPO | Admitting: Sports Medicine

## 2021-10-22 ENCOUNTER — Encounter: Payer: Self-pay | Admitting: Sports Medicine

## 2021-10-22 DIAGNOSIS — S61512A Laceration without foreign body of left wrist, initial encounter: Secondary | ICD-10-CM | POA: Diagnosis not present

## 2021-10-28 ENCOUNTER — Ambulatory Visit: Payer: BC Managed Care – PPO | Admitting: Sports Medicine

## 2021-10-28 ENCOUNTER — Ambulatory Visit (HOSPITAL_BASED_OUTPATIENT_CLINIC_OR_DEPARTMENT_OTHER): Payer: BC Managed Care – PPO

## 2021-10-29 ENCOUNTER — Encounter: Payer: Self-pay | Admitting: Sports Medicine

## 2021-10-30 MED ORDER — GABAPENTIN 300 MG PO CAPS: ORAL_CAPSULE | ORAL | 3 refills | Status: DC

## 2022-01-17 ENCOUNTER — Other Ambulatory Visit: Payer: Self-pay | Admitting: Sports Medicine

## 2022-01-17 DIAGNOSIS — F339 Major depressive disorder, recurrent, unspecified: Secondary | ICD-10-CM

## 2022-02-08 ENCOUNTER — Other Ambulatory Visit: Payer: Self-pay | Admitting: Sports Medicine

## 2022-02-10 ENCOUNTER — Telehealth: Payer: Self-pay

## 2022-02-10 NOTE — Telephone Encounter (Addendum)
Initiated Prior authorization for: ?Via: Covermymeds ?Case/Key: [id number] ?Status: approved  as of 4/26/ ?Reason:Drug is covered by current benefit plan. No further PA activity needed ?Notified Pt via: Mychart ? ?

## 2022-02-23 ENCOUNTER — Other Ambulatory Visit: Payer: Self-pay | Admitting: Sports Medicine

## 2022-02-23 DIAGNOSIS — F339 Major depressive disorder, recurrent, unspecified: Secondary | ICD-10-CM

## 2022-02-25 DIAGNOSIS — M79672 Pain in left foot: Secondary | ICD-10-CM | POA: Diagnosis not present

## 2022-02-25 DIAGNOSIS — M7989 Other specified soft tissue disorders: Secondary | ICD-10-CM | POA: Diagnosis not present

## 2022-02-25 DIAGNOSIS — X58XXXA Exposure to other specified factors, initial encounter: Secondary | ICD-10-CM | POA: Diagnosis not present

## 2022-02-25 DIAGNOSIS — M25572 Pain in left ankle and joints of left foot: Secondary | ICD-10-CM | POA: Diagnosis not present

## 2022-02-25 DIAGNOSIS — S82839A Other fracture of upper and lower end of unspecified fibula, initial encounter for closed fracture: Secondary | ICD-10-CM | POA: Diagnosis not present

## 2022-03-29 ENCOUNTER — Encounter (INDEPENDENT_AMBULATORY_CARE_PROVIDER_SITE_OTHER): Payer: BC Managed Care – PPO | Admitting: Sports Medicine

## 2022-03-29 DIAGNOSIS — F339 Major depressive disorder, recurrent, unspecified: Secondary | ICD-10-CM

## 2022-03-29 DIAGNOSIS — E66813 Obesity, class 3: Secondary | ICD-10-CM

## 2022-03-30 NOTE — Telephone Encounter (Signed)
I spent 5 total minutes of online digital evaluation and management services in this patient-initiated request for online care. 

## 2022-03-31 MED ORDER — FLUOXETINE HCL 20 MG PO TABS: 20.0000 mg | ORAL_TABLET | Freq: Every day | ORAL | 11 refills | Status: DC

## 2022-03-31 NOTE — Addendum Note (Signed)
Addended by: Monica Becton on: 03/31/2022 09:51 PM   Modules accepted: Orders

## 2022-03-31 NOTE — Assessment & Plan Note (Signed)
Had been taking 2 tabs by accident, felt a lot better so I'll increase the prozac dose.

## 2022-04-15 ENCOUNTER — Other Ambulatory Visit: Payer: Self-pay | Admitting: Sports Medicine

## 2022-04-15 DIAGNOSIS — F339 Major depressive disorder, recurrent, unspecified: Secondary | ICD-10-CM

## 2022-07-15 ENCOUNTER — Other Ambulatory Visit: Payer: Self-pay | Admitting: Sports Medicine

## 2022-07-15 DIAGNOSIS — F339 Major depressive disorder, recurrent, unspecified: Secondary | ICD-10-CM

## 2022-07-28 ENCOUNTER — Other Ambulatory Visit: Payer: Self-pay

## 2022-07-28 DIAGNOSIS — F339 Major depressive disorder, recurrent, unspecified: Secondary | ICD-10-CM

## 2022-07-28 MED ORDER — BUPROPION HCL ER (XL) 150 MG PO TB24: ORAL_TABLET | ORAL | 0 refills | Status: DC

## 2022-11-04 ENCOUNTER — Other Ambulatory Visit: Payer: Self-pay | Admitting: Sports Medicine

## 2022-11-04 DIAGNOSIS — F339 Major depressive disorder, recurrent, unspecified: Secondary | ICD-10-CM

## 2023-02-11 ENCOUNTER — Other Ambulatory Visit: Payer: Self-pay | Admitting: Sports Medicine

## 2023-02-11 DIAGNOSIS — F339 Major depressive disorder, recurrent, unspecified: Secondary | ICD-10-CM

## 2023-04-25 ENCOUNTER — Other Ambulatory Visit: Payer: Self-pay | Admitting: Sports Medicine

## 2023-04-25 DIAGNOSIS — F339 Major depressive disorder, recurrent, unspecified: Secondary | ICD-10-CM

## 2023-05-20 ENCOUNTER — Other Ambulatory Visit: Payer: Self-pay | Admitting: Sports Medicine

## 2023-05-20 DIAGNOSIS — F339 Major depressive disorder, recurrent, unspecified: Secondary | ICD-10-CM

## 2023-07-08 ENCOUNTER — Other Ambulatory Visit: Payer: Self-pay | Admitting: Sports Medicine

## 2023-07-26 ENCOUNTER — Other Ambulatory Visit: Payer: Self-pay | Admitting: Sports Medicine

## 2023-07-26 DIAGNOSIS — F339 Major depressive disorder, recurrent, unspecified: Secondary | ICD-10-CM

## 2023-08-24 ENCOUNTER — Encounter: Payer: Self-pay | Admitting: Sports Medicine

## 2023-08-24 ENCOUNTER — Telehealth (INDEPENDENT_AMBULATORY_CARE_PROVIDER_SITE_OTHER): Payer: BC Managed Care – PPO | Admitting: Sports Medicine

## 2023-08-24 VITALS — Ht 64.0 in | Wt 212.0 lb

## 2023-08-24 DIAGNOSIS — E559 Vitamin D deficiency, unspecified: Secondary | ICD-10-CM

## 2023-08-24 DIAGNOSIS — E66813 Obesity, class 3: Secondary | ICD-10-CM

## 2023-08-24 DIAGNOSIS — F339 Major depressive disorder, recurrent, unspecified: Secondary | ICD-10-CM

## 2023-08-24 DIAGNOSIS — Z0000 Encounter for general adult medical examination without abnormal findings: Secondary | ICD-10-CM

## 2023-08-24 DIAGNOSIS — E538 Deficiency of other specified B group vitamins: Secondary | ICD-10-CM

## 2023-08-24 MED ORDER — BUPROPION HCL ER (XL) 300 MG PO TB24
300.0000 mg | ORAL_TABLET | Freq: Every day | ORAL | 3 refills | Status: AC
Start: 2023-08-24 — End: ?

## 2023-08-24 NOTE — Assessment & Plan Note (Addendum)
Brittany Neal lives in [place] but she would like me to continue being her PCP. Needs mammogram and ultrasound, I have ordered these at [facility].

## 2023-08-24 NOTE — Progress Notes (Signed)
   Virtual Visit via WebEx/MyChart   I connected with  Brittany Neal  on 08/24/23 via WebEx/MyChart/Doximity Video and verified that I am speaking with the correct person using two identifiers.   I discussed the limitations, risks, security and privacy concerns of performing an evaluation and management service by WebEx/MyChart/Doximity Video, including the higher likelihood of inaccurate diagnosis and treatment, and the availability of in person appointments.  We also discussed the likely need of an additional face to face encounter for complete and high quality delivery of care.  I also discussed with the patient that there may be a patient responsible charge related to this service. The patient expressed understanding and wishes to proceed.  Provider location is in medical facility. Patient location is at their home, different from provider location. People involved in care of the patient during this telehealth encounter were myself, my nurse/medical assistant, and my front office/scheduling team member.  Review of Systems: No fevers, chills, night sweats, weight loss, chest pain, or shortness of breath.   Objective Findings:    General: Speaking full sentences, no audible heavy breathing.  Sounds alert and appropriately interactive.  Appears well.  Face symmetric.  Extraocular movements intact.  Pupils equal and round.  No nasal flaring or accessory muscle use visualized.  Independent interpretation of tests performed by another provider:   None.  Brief History, Exam, Impression, and Recommendations:    Annual physical exam Brittany Neal lives in [place] but she would like me to continue being her PCP. Needs mammogram and ultrasound, I have ordered these at [facility].  Depression Well-controlled with Wellbutrin 150, she would like to go up on the dose, increasing to 300, she discontinued Prozac due to weight gain.  Obesity, Class III, BMI 40-49.9 (morbid obesity) Has done compounded  tirzepatide that she gets from an online source. Checking labs, she did note some abnormalities in her MPV and MCHC on previous labs, we will recheck them today. She does need labs sent to a [facility] pharmacy in [place], she will send me a MyChart message letting us know where to fax the orders.   I discussed the above assessment and treatment plan with the patient. The patient was provided an opportunity to ask questions and all were answered. The patient agreed with the plan and demonstrated an understanding of the instructions.   The patient was advised to call back or seek an in-person evaluation if the symptoms worsen or if the condition fails to improve as anticipated.   I provided 30 minutes of face to face and non-face-to-face time during this encounter date, time was needed to gather information, review chart, records, communicate/coordinate with staff remotely, as well as complete documentation.   ____________________________________________ Ihor Austin. Benjamin Stain, M.D., ABFM., CAQSM., AME. Primary Care and Sports Medicine [facility]  Adjunct Professor of Family Medicine  [employer]  Restaurant manager, fast food

## 2023-08-24 NOTE — Assessment & Plan Note (Signed)
Well-controlled with Wellbutrin 150, she would like to go up on the dose, increasing to 300, she discontinued Prozac due to weight gain.

## 2023-08-24 NOTE — Assessment & Plan Note (Signed)
Has done compounded tirzepatide that she gets from an online source. Checking labs, she did note some abnormalities in her MPV and MCHC on previous labs, we will recheck them today. She does need labs sent to a [facility] pharmacy in [place], she will send me a MyChart message letting us know where to fax the orders.

## 2023-08-26 ENCOUNTER — Telehealth: Payer: BC Managed Care – PPO | Admitting: Sports Medicine

## 2023-08-26 ENCOUNTER — Encounter: Payer: Self-pay | Admitting: Sports Medicine

## 2023-09-28 ENCOUNTER — Other Ambulatory Visit: Payer: Self-pay | Admitting: Sports Medicine

## 2024-06-19 ENCOUNTER — Encounter: Payer: Self-pay | Admitting: Sports Medicine

## 2024-08-23 ENCOUNTER — Ambulatory Visit (HOSPITAL_BASED_OUTPATIENT_CLINIC_OR_DEPARTMENT_OTHER)

## 2024-08-27 ENCOUNTER — Ambulatory Visit (HOSPITAL_BASED_OUTPATIENT_CLINIC_OR_DEPARTMENT_OTHER)
